# Patient Record
Sex: Female | Born: 1935 | Race: Black or African American | Hispanic: No | Smoking: Never smoker
Health system: Southern US, Community
[De-identification: ages and names within clinical notes are randomized; demographics above are authoritative.]

## PROBLEM LIST (undated history)

## (undated) DIAGNOSIS — M949 Disorder of cartilage, unspecified: Secondary | ICD-10-CM

## (undated) DIAGNOSIS — K5730 Diverticulosis of large intestine without perforation or abscess without bleeding: Secondary | ICD-10-CM

## (undated) DIAGNOSIS — I872 Venous insufficiency (chronic) (peripheral): Secondary | ICD-10-CM

## (undated) DIAGNOSIS — E559 Vitamin D deficiency, unspecified: Secondary | ICD-10-CM

## (undated) DIAGNOSIS — I10 Essential (primary) hypertension: Secondary | ICD-10-CM

## (undated) DIAGNOSIS — M899 Disorder of bone, unspecified: Secondary | ICD-10-CM

## (undated) DIAGNOSIS — M159 Polyosteoarthritis, unspecified: Secondary | ICD-10-CM

## (undated) DIAGNOSIS — H4920 Sixth [abducent] nerve palsy, unspecified eye: Secondary | ICD-10-CM

## (undated) DIAGNOSIS — G510 Bell's palsy: Secondary | ICD-10-CM

## (undated) DIAGNOSIS — E7800 Pure hypercholesterolemia, unspecified: Secondary | ICD-10-CM

## (undated) DIAGNOSIS — H9190 Unspecified hearing loss, unspecified ear: Secondary | ICD-10-CM

## (undated) DIAGNOSIS — L723 Sebaceous cyst: Secondary | ICD-10-CM

## (undated) HISTORY — PX: CATARACT EXTRACTION: SUR2

## (undated) HISTORY — DX: Disorder of cartilage, unspecified: M94.9

## (undated) HISTORY — DX: Disorder of bone, unspecified: M89.9

## (undated) HISTORY — DX: Unspecified hearing loss, unspecified ear: H91.90

## (undated) HISTORY — DX: Pure hypercholesterolemia, unspecified: E78.00

## (undated) HISTORY — DX: Polyosteoarthritis, unspecified: M15.9

## (undated) HISTORY — DX: Essential (primary) hypertension: I10

## (undated) HISTORY — DX: Sixth (abducent) nerve palsy, unspecified eye: H49.20

## (undated) HISTORY — DX: Venous insufficiency (chronic) (peripheral): I87.2

## (undated) HISTORY — DX: Bell's palsy: G51.0

## (undated) HISTORY — DX: Vitamin D deficiency, unspecified: E55.9

## (undated) HISTORY — DX: Sebaceous cyst: L72.3

## (undated) HISTORY — DX: Diverticulosis of large intestine without perforation or abscess without bleeding: K57.30

---

## 1972-06-27 HISTORY — PX: VESICOVAGINAL FISTULA CLOSURE W/ TAH: SUR271

## 1999-07-07 ENCOUNTER — Encounter: Admission: RE | Admit: 1999-07-07 | Discharge: 1999-07-07 | Payer: Self-pay | Admitting: Pulmonary Disease

## 1999-07-07 ENCOUNTER — Encounter: Payer: Self-pay | Admitting: Pulmonary Disease

## 1999-07-13 ENCOUNTER — Other Ambulatory Visit: Admission: RE | Admit: 1999-07-13 | Discharge: 1999-07-13 | Payer: Self-pay | Admitting: Obstetrics & Gynecology

## 1999-07-15 ENCOUNTER — Encounter: Admission: RE | Admit: 1999-07-15 | Discharge: 1999-07-15 | Payer: Self-pay | Admitting: Pulmonary Disease

## 1999-07-15 ENCOUNTER — Encounter: Payer: Self-pay | Admitting: Pulmonary Disease

## 1999-12-14 ENCOUNTER — Encounter: Admission: RE | Admit: 1999-12-14 | Discharge: 1999-12-14 | Payer: Self-pay | Admitting: Diagnostic Radiology

## 1999-12-14 ENCOUNTER — Encounter: Payer: Self-pay | Admitting: Diagnostic Radiology

## 2000-11-29 ENCOUNTER — Other Ambulatory Visit: Admission: RE | Admit: 2000-11-29 | Discharge: 2000-11-29 | Payer: Self-pay | Admitting: Obstetrics & Gynecology

## 2001-12-26 ENCOUNTER — Other Ambulatory Visit: Admission: RE | Admit: 2001-12-26 | Discharge: 2001-12-26 | Payer: Self-pay | Admitting: Obstetrics & Gynecology

## 2001-12-31 ENCOUNTER — Encounter: Payer: Self-pay | Admitting: Obstetrics & Gynecology

## 2001-12-31 ENCOUNTER — Encounter: Admission: RE | Admit: 2001-12-31 | Discharge: 2001-12-31 | Payer: Self-pay | Admitting: Obstetrics & Gynecology

## 2002-01-04 ENCOUNTER — Encounter: Payer: Self-pay | Admitting: Obstetrics & Gynecology

## 2002-01-04 ENCOUNTER — Encounter: Admission: RE | Admit: 2002-01-04 | Discharge: 2002-01-04 | Payer: Self-pay | Admitting: Obstetrics & Gynecology

## 2003-02-04 ENCOUNTER — Other Ambulatory Visit: Admission: RE | Admit: 2003-02-04 | Discharge: 2003-02-04 | Payer: Self-pay | Admitting: Obstetrics & Gynecology

## 2003-03-13 ENCOUNTER — Encounter: Payer: Self-pay | Admitting: Obstetrics & Gynecology

## 2003-03-13 ENCOUNTER — Encounter: Admission: RE | Admit: 2003-03-13 | Discharge: 2003-03-13 | Payer: Self-pay | Admitting: Obstetrics & Gynecology

## 2004-03-24 ENCOUNTER — Encounter: Admission: RE | Admit: 2004-03-24 | Discharge: 2004-03-24 | Payer: Self-pay | Admitting: Pulmonary Disease

## 2004-04-27 ENCOUNTER — Ambulatory Visit: Payer: Self-pay | Admitting: Pulmonary Disease

## 2004-05-12 ENCOUNTER — Ambulatory Visit: Payer: Self-pay | Admitting: Pulmonary Disease

## 2004-08-27 ENCOUNTER — Ambulatory Visit: Payer: Self-pay | Admitting: Pulmonary Disease

## 2004-12-31 ENCOUNTER — Ambulatory Visit: Payer: Self-pay | Admitting: Pulmonary Disease

## 2005-03-25 ENCOUNTER — Encounter: Admission: RE | Admit: 2005-03-25 | Discharge: 2005-03-25 | Payer: Self-pay | Admitting: Pulmonary Disease

## 2005-07-01 ENCOUNTER — Ambulatory Visit: Payer: Self-pay | Admitting: Pulmonary Disease

## 2005-12-29 ENCOUNTER — Ambulatory Visit: Payer: Self-pay | Admitting: Pulmonary Disease

## 2006-03-28 ENCOUNTER — Encounter: Admission: RE | Admit: 2006-03-28 | Discharge: 2006-03-28 | Payer: Self-pay | Admitting: Pulmonary Disease

## 2006-04-12 ENCOUNTER — Ambulatory Visit: Payer: Self-pay | Admitting: Pulmonary Disease

## 2006-05-29 ENCOUNTER — Ambulatory Visit: Payer: Self-pay | Admitting: Gastroenterology

## 2006-05-31 ENCOUNTER — Ambulatory Visit: Payer: Self-pay | Admitting: Pulmonary Disease

## 2006-06-28 ENCOUNTER — Ambulatory Visit: Payer: Self-pay | Admitting: Gastroenterology

## 2006-07-10 ENCOUNTER — Ambulatory Visit: Payer: Self-pay | Admitting: Gastroenterology

## 2006-11-27 ENCOUNTER — Ambulatory Visit: Payer: Self-pay | Admitting: Pulmonary Disease

## 2006-11-27 LAB — CONVERTED CEMR LAB
Albumin: 3.6 g/dL (ref 3.5–5.2)
Basophils Absolute: 0 10*3/uL (ref 0.0–0.1)
Chloride: 107 meq/L (ref 96–112)
Cholesterol: 184 mg/dL (ref 0–200)
Glucose, Bld: 99 mg/dL (ref 70–99)
HCT: 37.4 % (ref 36.0–46.0)
LDL Cholesterol: 113 mg/dL — ABNORMAL HIGH (ref 0–99)
Monocytes Absolute: 0.5 10*3/uL (ref 0.2–0.7)
Monocytes Relative: 15.6 % — ABNORMAL HIGH (ref 3.0–11.0)
Neutro Abs: 2 10*3/uL (ref 1.4–7.7)
Neutrophils Relative %: 63.9 % (ref 43.0–77.0)
Potassium: 4.1 meq/L (ref 3.5–5.1)
RBC: 4.62 M/uL (ref 3.87–5.11)
Total CHOL/HDL Ratio: 3.4
Triglycerides: 86 mg/dL (ref 0–149)
VLDL: 17 mg/dL (ref 0–40)
WBC: 3.1 10*3/uL — ABNORMAL LOW (ref 4.5–10.5)

## 2007-04-12 ENCOUNTER — Encounter: Admission: RE | Admit: 2007-04-12 | Discharge: 2007-04-12 | Payer: Self-pay | Admitting: Pulmonary Disease

## 2007-04-13 ENCOUNTER — Ambulatory Visit: Payer: Self-pay | Admitting: Pulmonary Disease

## 2007-04-17 DIAGNOSIS — I10 Essential (primary) hypertension: Secondary | ICD-10-CM

## 2007-04-17 DIAGNOSIS — M159 Polyosteoarthritis, unspecified: Secondary | ICD-10-CM | POA: Insufficient documentation

## 2007-05-28 ENCOUNTER — Ambulatory Visit: Payer: Self-pay | Admitting: Pulmonary Disease

## 2007-05-28 DIAGNOSIS — I872 Venous insufficiency (chronic) (peripheral): Secondary | ICD-10-CM | POA: Insufficient documentation

## 2007-09-03 ENCOUNTER — Ambulatory Visit: Payer: Self-pay | Admitting: Pulmonary Disease

## 2007-09-03 DIAGNOSIS — L723 Sebaceous cyst: Secondary | ICD-10-CM

## 2007-09-03 DIAGNOSIS — E7800 Pure hypercholesterolemia, unspecified: Secondary | ICD-10-CM | POA: Insufficient documentation

## 2007-12-27 ENCOUNTER — Encounter: Payer: Self-pay | Admitting: Pulmonary Disease

## 2008-01-18 ENCOUNTER — Ambulatory Visit: Payer: Self-pay | Admitting: Pulmonary Disease

## 2008-01-18 DIAGNOSIS — M899 Disorder of bone, unspecified: Secondary | ICD-10-CM | POA: Insufficient documentation

## 2008-01-18 DIAGNOSIS — H9190 Unspecified hearing loss, unspecified ear: Secondary | ICD-10-CM | POA: Insufficient documentation

## 2008-01-18 DIAGNOSIS — H4920 Sixth [abducent] nerve palsy, unspecified eye: Secondary | ICD-10-CM

## 2008-01-18 DIAGNOSIS — G510 Bell's palsy: Secondary | ICD-10-CM

## 2008-01-18 DIAGNOSIS — K5730 Diverticulosis of large intestine without perforation or abscess without bleeding: Secondary | ICD-10-CM | POA: Insufficient documentation

## 2008-01-18 DIAGNOSIS — M949 Disorder of cartilage, unspecified: Secondary | ICD-10-CM

## 2008-01-19 LAB — CONVERTED CEMR LAB
ALT: 12 units/L (ref 0–35)
Alkaline Phosphatase: 59 units/L (ref 39–117)
Basophils Absolute: 0 10*3/uL (ref 0.0–0.1)
Bilirubin, Direct: 0.1 mg/dL (ref 0.0–0.3)
CO2: 31 meq/L (ref 19–32)
Cholesterol: 169 mg/dL (ref 0–200)
Creatinine, Ser: 0.9 mg/dL (ref 0.4–1.2)
Eosinophils Relative: 8.1 % — ABNORMAL HIGH (ref 0.0–5.0)
Glucose, Bld: 94 mg/dL (ref 70–99)
HDL: 49.8 mg/dL (ref >39.0)
Hemoglobin: 12.4 g/dL (ref 12.0–15.0)
LDL Cholesterol: 106 mg/dL — ABNORMAL HIGH (ref 0–99)
MCHC: 33.2 g/dL (ref 30.0–36.0)
Monocytes Absolute: 0.5 10*3/uL (ref 0.1–1.0)
Monocytes Relative: 16.1 % — ABNORMAL HIGH (ref 3.0–12.0)
Platelets: 231 10*3/uL (ref 150–400)
RDW: 13 % (ref 11.5–14.6)
Sodium: 143 meq/L (ref 135–145)
Total CHOL/HDL Ratio: 3.4
VLDL: 13 mg/dL (ref 0–40)
WBC: 3.4 10*3/uL — ABNORMAL LOW (ref 4.5–10.5)

## 2008-02-07 ENCOUNTER — Encounter: Payer: Self-pay | Admitting: Pulmonary Disease

## 2008-02-08 ENCOUNTER — Encounter: Payer: Self-pay | Admitting: Pulmonary Disease

## 2008-02-08 LAB — CONVERTED CEMR LAB: Vit D, 1,25-Dihydroxy: 15 — ABNORMAL LOW (ref 30–89)

## 2008-07-18 ENCOUNTER — Ambulatory Visit: Payer: Self-pay | Admitting: Pulmonary Disease

## 2008-07-28 LAB — CONVERTED CEMR LAB
BUN: 8 mg/dL (ref 6–23)
CO2: 32 meq/L (ref 19–32)
Creatinine, Ser: 0.8 mg/dL (ref 0.4–1.2)
Sodium: 144 meq/L (ref 135–145)
Vit D, 1,25-Dihydroxy: 27 — ABNORMAL LOW (ref 30–89)

## 2008-08-26 ENCOUNTER — Encounter: Payer: Self-pay | Admitting: Pulmonary Disease

## 2009-01-14 ENCOUNTER — Ambulatory Visit: Payer: Self-pay | Admitting: Pulmonary Disease

## 2009-01-18 DIAGNOSIS — E559 Vitamin D deficiency, unspecified: Secondary | ICD-10-CM | POA: Insufficient documentation

## 2009-01-18 LAB — CONVERTED CEMR LAB
Alkaline Phosphatase: 56 units/L (ref 39–117)
Basophils Absolute: 0 10*3/uL (ref 0.0–0.1)
Basophils Relative: 0.1 % (ref 0.0–3.0)
Calcium: 8.9 mg/dL (ref 8.4–10.5)
Chloride: 105 meq/L (ref 96–112)
Cholesterol: 174 mg/dL (ref 0–200)
Creatinine, Ser: 0.9 mg/dL (ref 0.4–1.2)
GFR calc non Af Amer: 76.46 mL/min (ref >60)
HDL: 54 mg/dL (ref >39.00)
Hemoglobin: 12.3 g/dL (ref 12.0–15.0)
Lymphocytes Relative: 14.1 % (ref 12.0–46.0)
Lymphs Abs: 0.5 10*3/uL — ABNORMAL LOW (ref 0.7–4.0)
Monocytes Absolute: 0.3 10*3/uL (ref 0.1–1.0)
Monocytes Relative: 10.1 % (ref 3.0–12.0)
Neutro Abs: 2.4 10*3/uL (ref 1.4–7.7)
Platelets: 211 10*3/uL (ref 150.0–400.0)
RDW: 13.1 % (ref 11.5–14.6)
Sodium: 144 meq/L (ref 135–145)
TSH: 1.11 microintl units/mL (ref 0.35–5.50)
Total Bilirubin: 0.8 mg/dL (ref 0.3–1.2)
Total CHOL/HDL Ratio: 3
Vit D, 25-Hydroxy: 48 ng/mL (ref 30–89)

## 2009-03-25 ENCOUNTER — Ambulatory Visit: Payer: Self-pay | Admitting: Pulmonary Disease

## 2009-03-30 ENCOUNTER — Encounter: Payer: Self-pay | Admitting: Pulmonary Disease

## 2009-03-31 ENCOUNTER — Telehealth: Payer: Self-pay | Admitting: Pulmonary Disease

## 2009-04-28 ENCOUNTER — Telehealth (INDEPENDENT_AMBULATORY_CARE_PROVIDER_SITE_OTHER): Payer: Self-pay | Admitting: *Deleted

## 2009-07-29 ENCOUNTER — Ambulatory Visit: Payer: Self-pay | Admitting: Pulmonary Disease

## 2009-07-31 LAB — CONVERTED CEMR LAB
ALT: 14 units/L (ref 0–35)
Basophils Absolute: 0 10*3/uL (ref 0.0–0.1)
CO2: 33 meq/L — ABNORMAL HIGH (ref 19–32)
Creatinine, Ser: 0.9 mg/dL (ref 0.4–1.2)
Eosinophils Relative: 5.8 % — ABNORMAL HIGH (ref 0.0–5.0)
Glucose, Bld: 91 mg/dL (ref 70–99)
HDL: 64.1 mg/dL (ref >39.00)
Lymphocytes Relative: 17.8 % (ref 12.0–46.0)
Monocytes Absolute: 0.5 10*3/uL (ref 0.1–1.0)
Monocytes Relative: 15.2 % — ABNORMAL HIGH (ref 3.0–12.0)
Neutro Abs: 1.8 10*3/uL (ref 1.4–7.7)
Platelets: 220 10*3/uL (ref 150.0–400.0)
Potassium: 3.8 meq/L (ref 3.5–5.1)
RDW: 13.5 % (ref 11.5–14.6)
Sodium: 142 meq/L (ref 135–145)
TSH: 1.15 microintl units/mL (ref 0.35–5.50)
Total CHOL/HDL Ratio: 3
Triglycerides: 74 mg/dL (ref 0.0–149.0)
VLDL: 14.8 mg/dL (ref 0.0–40.0)
Vit D, 25-Hydroxy: 35 ng/mL (ref 30–89)
WBC: 3 10*3/uL — ABNORMAL LOW (ref 4.5–10.5)

## 2009-09-10 ENCOUNTER — Ambulatory Visit: Payer: Self-pay | Admitting: Pulmonary Disease

## 2009-09-13 DIAGNOSIS — L80 Vitiligo: Secondary | ICD-10-CM

## 2010-01-25 ENCOUNTER — Ambulatory Visit: Payer: Self-pay | Admitting: Pulmonary Disease

## 2010-01-31 LAB — CONVERTED CEMR LAB
AST: 22 units/L (ref 0–37)
Alkaline Phosphatase: 62 units/L (ref 39–117)
Bilirubin, Direct: 0.1 mg/dL (ref 0.0–0.3)
Chloride: 105 meq/L (ref 96–112)
Creatinine, Ser: 0.7 mg/dL (ref 0.4–1.2)
Glucose, Bld: 96 mg/dL (ref 70–99)
Sodium: 143 meq/L (ref 135–145)
Total Bilirubin: 0.7 mg/dL (ref 0.3–1.2)
Total CHOL/HDL Ratio: 3
Triglycerides: 65 mg/dL (ref 0.0–149.0)
VLDL: 13 mg/dL (ref 0.0–40.0)

## 2010-03-22 ENCOUNTER — Ambulatory Visit: Payer: Self-pay | Admitting: Pulmonary Disease

## 2010-07-02 ENCOUNTER — Encounter: Payer: Self-pay | Admitting: Pulmonary Disease

## 2010-07-29 NOTE — Consult Note (Signed)
Summary: [facility]  [facility]   Imported By: Sherian Rein 07/13/2010 15:41:58  _____________________________________________________________________  External Attachment:    Type:   Image     Comment:   External Document

## 2010-07-29 NOTE — Assessment & Plan Note (Signed)
Summary: 6 months/apc   CC:  6 month ROV & review of mult medical problems....  History of Present Illness: 77 y/o WF here for a follow up visit... she has multiple medical problems as noted below... she has HBP, Hyperchol, DJD/ Osteopenia, Vit D defic...   ~  January 14, 2009:  she's had a good 48months.. 77 y/o- no new complaints or concerns... due for follow up FASTING labs, & both Tetanus & Pneumonia shots today...   ~  July 29, 2009:  another good 48mo- c/o some arthritis pain in hips and knees... she has Etodolac Bid which helps, offered Ortho referral but she declines "I don't want shots"... BP controlled, chol = fair on the Zetia + diet, DrLavoie stopped her Actonel for a drug holiday...   ~  January 25, 2010:  she is concerned about "spots" on her neck = vitiligo & she is reassured... also notes some constip but notes that prunesdo the job... some intermittent left shoulder blade area discomfort relieved by heat/ Tylenol... BP controlled on meds;  Chol looks good on the Zetia;  otherw stable...    Current Problem List:  HEARING LOSS (ICD-389.9) - she saw DrBates Aug09 w/ cerumen impactions removed and improvement in her hearing since then...  HYPERTENSION (ICD-401.9) - on ASA 81mg /d,  FELODIPINE 5mg /d, LISINOPRIL 40mg /d, LASIX 40mg /d... BP= 142/84 today and tolerating meds well... denies HA, fatigue, visual changes, CP, palipit, dizziness, syncope, dyspnea, edema, etc...  ~  labs 1/10 showed BUN= 8, Creat= 0.8, K= 4.6  ~  labs 2/11 showed norm BMet...  VENOUS INSUFFICIENCY (ICD-459.81) - on low salt diet, and Lasix...  HYPERCHOLESTEROLEMIA (ICD-272.0) - on diet and ZETIA 10mg /d...  ~  FLP 6/08 showed TChol 184, TG 86, HDL 54, LDL 113  ~  FLP 7/09 showed TChol 169, TG 65, HDL 50, LDL 106  ~  FLP 7/10 showed TChol 174, TG 67, HDL 54, LDL 107  ~  FLP 2/11 showed TChol 180, TG 74, HDL 64, LDL 101  ~  FLP 8/11 showed TChol 168, TG 65, HDL 53, LDL 102  DIVERTICULOSIS OF COLON  (ICD-562.10) - she notes occas constipation (uses prunes prn)... last colonoscopy 1/08 by DrPatterson was neg...  DEGENERATIVE JOINT DISEASE, GENERALIZED (ICD-715.00) - uses TYLENOL as needed, and has ETODOLAC 400mg  Prn as well... c/o hip & knee pains- offered Ortho referral but she declines... never tried the knee braces... uses a cane.  OSTEOPENIA (ICD-733.90) - on calcium, multivits, & Vit D... prev on Actonel & this was held by [facility] for a drug holiday (stopped in 2010),,,  ~  BMD 3/10 showed TScores -1.5 in Spine & -1.8 left FemNeck... continue meds.  VITAMIN D DEFICIENCY (ICD-268.9)  ~  labs 7/09 showed Vit D level= 15... rec- start Vit D 50,000 u weekly.  ~  labs 1/10 showed Vit D level = 27... continue 50K.  ~  labs 7/10 showed Vit D level = 48... OK to change to 1000 u daily.  ~  labs 2/11 showed Vit D level = 35  Hx of BELLS PALSY (ICD-351.0) - hx of left Bell's palsy in 1980... resolved w/o residual wkness...  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 Mammogram 10/08 neg... had TETANUS shot & PNEUMOVAX- given 7/10.   Preventive Screening-Counseling & Management  Alcohol-Tobacco     Smoking Status: never  Allergies (verified): No Known Drug Allergies  Comments:  Nurse/Medical Assistant: The patient's medications and allergies were reviewed with the patient and were updated in the Medication and Allergy Lists.  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 mother died age 34    father died age 55   hx of dm 2 siblings 1 brother alive age 41  hx of dm 1 brother alive age 49---hx of hip replacements  Social History: Reviewed history from 01/18/2008 and no changes required. widowed retired never smoked no etoh 5 adopted children  Review of Systems      See HPI       The patient complains of dyspnea on exertion and difficulty walking.  The patient denies anorexia, fever, weight loss, weight gain, vision loss, decreased hearing, hoarseness, chest pain, syncope, peripheral edema, prolonged cough, headaches, hemoptysis, abdominal pain, melena, hematochezia, severe indigestion/heartburn, hematuria, incontinence, muscle weakness, suspicious skin lesions, transient blindness, depression, unusual weight change, abnormal bleeding, enlarged lymph nodes, and angioedema.    Vital Signs:  Patient profile:   77 year old female Height:      63.5 inches Weight:      162.38 pounds BMI:     28.42 O2 Sat:      100 % on Room air Temp:     99.4 degrees F oral Pulse rate:   84 / minute BP sitting:   142 / 84  (right arm) Cuff size:   regular  Vitals Entered By: Randell Loop CMA (January 25, 2010 11:24 AM)  O2 Sat at Rest %:  100 O2 Flow:  Room air CC: 6 month ROV & review of mult medical problems... Is Patient Diabetic? No Pain Assessment Patient in pain? no      Comments no med changes today   Physical Exam  Additional Exam:  WD, WN, 77 y/o BF in NAD... GENERAL:  Alert & oriented; pleasant & cooperative... HEENT:  [place]/AT, EOM-wnl, PERRLA, EACs- recurrent debris bilat, TMs- not well seen, NOSE-clear, THROAT-clear & wnl. NECK:  Supple w/ fairROM; no JVD; normal carotid impulses w/o bruits; no thyromegaly or nodules palpated; no lymphadenopathy. CHEST:  Clear to P & A; without wheezes/ rales/ or rhonchi  heard... HEART:  Regular Rhythm; gr 1/6 SEM, no rubs or gallops detected... ABDOMEN:  Soft & nontender; normal bowel sounds; no organomegaly or masses palpated... EXT: without deformities, mod arthritic changes; no varicose veins/ +venous insuffic/ tr edema. NEURO:  CN's intact; no focal neuro deficits... DERM:  No lesions noted; no rash etc...    MISC. Report  Procedure date:  01/25/2010  Findings:      BMP (METABOL)   Sodium                    143 mEq/L                   135-145   Potassium                 4.0 mEq/L                   3.5-5.1   Chloride  105 mEq/L                   96-112   Carbon Dioxide            31 mEq/L                    19-32   Glucose                   96 mg/dL                    16-10   BUN                       8 mg/dL                     9-60   Creatinine                0.7 mg/dL                   4.5-4.0   Calcium                   8.7 mg/dL                   9.8-11.9   GFR                       95.61 mL/min                >60  Hepatic/Liver Function Panel (HEPATIC)   Total Bilirubin           0.7 mg/dL                   1.4-7.8   Direct Bilirubin          0.1 mg/dL                   2.9-5.6   Alkaline Phosphatase      62 U/L                      39-117   AST                       22 U/L                      0-37   ALT                       10 U/L                      0-35   Total Protein             6.9 g/dL                    2.1-3.0   Albumin                   3.7 g/dL                    8.6-5.7  Tests: (3) Lipid Panel (LIPID)   Cholesterol               168 mg/dL  0-200   Triglycerides             65.0 mg/dL                  4.6-962.9   HDL                       52.84 mg/dL                 >13.24   LDL Cholesterol      [H]  401 mg/dL                   0-27   Impression & Recommendations:  Problem # 1:  HYPERTENSION (ICD-401.9) Adeq control on these meds... Her updated medication list for this problem  includes:    Felodipine 5 Mg Xr24h-tab (Felodipine) .Marland Kitchen... Take 1 tab by mouth once daily...    Lisinopril 40 Mg Tabs (Lisinopril) .Marland Kitchen... Take 1 tablet by mouth once a day    Furosemide 40 Mg Tabs (Furosemide) .Marland Kitchen... Take 1 tablet by mouth once a day  Orders: TLB-BMP (Basic Metabolic Panel-BMET) (80048-METABOL) TLB-Hepatic/Liver Function Pnl (80076-HEPATIC) TLB-Lipid Panel (80061-LIPID)  Problem # 2:  HYPERCHOLESTEROLEMIA (ICD-272.0) FLP is reasonable on the diet + Zetia Rx... Her updated medication list for this problem includes:    Zetia 10 Mg Tabs (Ezetimibe) .Marland Kitchen... Take 1 tablet by mouth once a day  Problem # 3:  DEGENERATIVE JOINT DISEASE, GENERALIZED (ICD-715.00) She uses the Etodolac & Tylenol as needed... Her updated medication list for this problem includes:    Adult Aspirin Ec Low Strength 81 Mg Tbec (Aspirin) .Marland Kitchen... Take 1 tablet by mouth once a day    Etodolac 400 Mg Tabs (Etodolac) .Marland Kitchen... Take 1 tab by mouth two times a day w/ food as needed for arthritis pain...  Problem # 4:  OSTEOPENIA (ICD-733.90) GYN has her on a drug holiday... reminded to take the calcium, MVI, Vit D supplements...  Problem # 5:  OTHER MEDICAL PROBLEMS AS NOTED>>> All in all she is doing remarkably well for 88...  Complete Medication List: 1)  Adult Aspirin Ec Low Strength 81 Mg Tbec (Aspirin) .... Take 1 tablet by mouth once a day 2)  Felodipine 5 Mg Xr24h-tab (Felodipine) .... Take 1 tab by mouth once daily.Marland KitchenMarland Kitchen 3)  Lisinopril 40 Mg Tabs (Lisinopril) .... Take 1 tablet by mouth once a day 4)  Furosemide 40 Mg Tabs (Furosemide) .... Take 1 tablet by mouth once a day 5)  Zetia 10 Mg Tabs (Ezetimibe) .... Take 1 tablet by mouth once a day 6)  Etodolac 400 Mg Tabs (Etodolac) .... Take 1 tab by mouth two times a day w/ food as needed for arthritis pain.Marland KitchenMarland Kitchen 7)  Vitamin D 1000 Unit Caps (Cholecalciferol) .... Take 1 cap by mouth once daily...  Patient Instructions: 1)  Today we updated your med list- see  below.... 2)  Continue your current meds the same... 3)  Today we did your follow up FASTING blood work... please call the "phone tree" in a few days for your lab results.Marland KitchenMarland Kitchen 4)  Call for any questions & remember to stay as active as poss... 5)  Please schedule a follow-up appointment in 6 months.

## 2010-07-29 NOTE — Assessment & Plan Note (Signed)
Summary: follow up pt has 2 accts other # [id number]   CC:  6 month ROV & review of mult medical problems....  History of Present Illness: 77 y/o WF here for a follow up visit... she has multiple medical problems as noted below... she has HBP, Hyperchol, DJD/ Osteopenia, Vit D defic...   ~  January 14, 2009:  she's had a good 13months.. 77 y/o- no new complaints or concerns... due for follow up FASTING labs, & both Tetanus & Pneumonia shots today...   ~  July 29, 2009:  another good 13mo- c/o some arthritis pain in hips and knees... she has Etodolac Bid which helps, offered Ortho referral but she declines "I don't want shots"... BP controlled, chol = fair on the Zetia + diet, DrLavoie stopped her Actonel for a drug holiday...    Current Problem List:  HEARING LOSS (ICD-389.9) - she saw DrBates Aug09 w/ cerumen impactions removed and improvement in her hearing since then...  HYPERTENSION (ICD-401.9) - on ASA 81mg /d,  FELODIPINE 5mg /d, LISINOPRIL 40mg /d, LASIX 40mg /d... BP= 140/80 today and tolerating meds well... denies HA, fatigue, visual changes, CP, palipit, dizziness, syncope, dyspnea, edema, etc...  ~  labs 1/10 showed BUN= 8, Creat= 0.8, K= 4.6  ~  labs 2/11 showed norm BMet...  VENOUS INSUFFICIENCY (ICD-459.81) - on low salt diet, and Lasix...  HYPERCHOLESTEROLEMIA (ICD-272.0) - on diet and ZETIA 10mg /d...  ~  FLP 6/08 showed TChol 184, TG 86, HDL 54, LDL 113  ~  FLP 7/09 showed TChol 169, TG 65, HDL 50, LDL 106  ~  FLP 7/10 showed TChol 174, TG 67, HDL 54, LDL 107  ~  FLP 2/11 showed TChol 180, TG 74, HDL 64, LDL 101  DIVERTICULOSIS OF COLON (ICD-562.10) - she notes occas constipation (uses prunes prn)... last colonoscopy 1/08 by DrPatterson was neg...  DEGENERATIVE JOINT DISEASE, GENERALIZED (ICD-715.00) - on ETODOLAC 400mg Bid as needed... c/o hip & knee pains- offered Ortho referral but she declines... never tried the knee braces... uses a cane.  OSTEOPENIA (ICD-733.90) -  on calcium, multivits, & Vit D... prev on Actonel & this was held by [facility] for a drug holiday (stopped in 2010),,,  ~  BMD 3/10 showed TScores -1.5 in Spine & -1.8 left FemNeck... continue meds.  VITAMIN D DEFICIENCY (ICD-268.9)  ~  labs 7/09 showed Vit D level= 15... rec- start Vit D 50,000 u weekly.  ~  labs 1/10 showed Vit D level = 27... continue 50K.  ~  labs 7/10 showed Vit D level = 48... OK to change to 1000 u daily.  ~  labs 2/11 showed Vit D level = 35  Hx of BELLS PALSY (ICD-351.0) - hx of left Bell's palsy in 1980... resolved w/o residual wkness...  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 Mammogram 10/08 neg... had TETANUS shot & PNEUMOVAX- given 7/10.   R Allergies (verified): No Known Drug Allergies  Comments:  Nurse/Medical Assistant: The patient's medications and allergies were reviewed with the patient and were updated in the Medication and Allergy Lists.  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 mother died age 43    father died age 93   hx of dm 2 siblings 1 brother alive age 74  hx of dm 1 brother alive age 55---hx of hip replacements  Social History: Reviewed history from 01/18/2008 and no changes required. widowed retired never smoked no etoh 5 adopted children  Review of Systems      See HPI        The patient complains of dyspnea on exertion, muscle weakness, and difficulty walking.  The patient denies anorexia, fever, weight loss, weight gain, vision loss, decreased hearing, hoarseness, chest pain, syncope, peripheral edema, prolonged cough, headaches, hemoptysis, abdominal pain, melena, hematochezia, severe indigestion/heartburn, hematuria, incontinence, suspicious skin lesions, transient blindness, depression, unusual weight change, abnormal bleeding, enlarged lymph nodes, and angioedema.    Vital Signs:  Patient profile:   77 year old female Height:      63.5 inches Weight:      164.50 pounds O2 Sat:      97 % on Room air Temp:     98.1 degrees F oral Pulse rate:   70 / minute BP sitting:   140 / 80  (right arm) Cuff size:   regular  Vitals Entered By: Randell Loop CMA (July 29, 2009 3:11 PM)  O2 Sat at Rest %:  97 O2 Flow:  Room air CC: 6 month ROV & review of mult medical problems... Is Patient Diabetic? No Pain Assessment Patient in pain? yes      Onset of pain  rt hip down into leg that has been hurting for some time Comments pt brought all meds today---meds updated today   Physical Exam  Additional Exam:  WD, WN, 77 y/o BF in NAD... GENERAL:  Alert & oriented; pleasant & cooperative... HEENT:  [place]/AT, EOM-wnl, PERRLA, EACs- recurrent debris bilat, TMs- not well seen, NOSE-clear, THROAT-clear & wnl. NECK:  Supple w/ fairROM; no JVD; normal carotid impulses w/o bruits; no thyromegaly or nodules palpated; no lymphadenopathy. CHEST:  Clear to P & A; without wheezes/ rales/ or rhonchi heard... HEART:  Regular Rhythm; gr 1/6 SEM, no rubs or gallops detected... ABDOMEN:  Soft & nontender; normal bowel sounds; no organomegaly or masses palpated... EXT: without deformities, mod arthritic changes; no varicose veins/ +venous insuffic/ tr edema. NEURO:  CN's intact; no focal neuro deficits... DERM:  No lesions noted; no rash etc...     MISC. Report  Procedure  date:  07/29/2009  Findings:      Lipid Panel (LIPID)   Cholesterol               180 mg/dL                   9-562   Triglycerides             74.0 mg/dL                  1.3-086.5   HDL                       78.46 mg/dL                 >96.29   LDL Cholesterol      [H]  528 mg/dL                   4-13  BMP (METABOL)   Sodium  142 mEq/L                   135-145   Potassium                 3.8 mEq/L                   3.5-5.1   Chloride                  101 mEq/L                   96-112   Carbon Dioxide       [H]  33 mEq/L                    19-32   Glucose                   91 mg/dL                    29-56   BUN                  [L]  5 mg/dL                     2-13   Creatinine                0.9 mg/dL                   0.8-6.5   Calcium                   9.1 mg/dL                   7.8-46.9   GFR                       76.37 mL/min                >60  Hepatic/Liver Function Panel (HEPATIC)   Total Bilirubin           0.6 mg/dL                   6.2-9.5   Direct Bilirubin          0.1 mg/dL                   2.8-4.1   Alkaline Phosphatase      61 U/L                      39-117   AST                       21 U/L                      0-37   ALT                       14 U/L                      0-35   Total Protein             7.3 g/dL                    3.2-4.4   Albumin  3.8 g/dL                    0.4-5.4  Comments:      CBC Platelet w/Diff (CBCD)   White Cell Count     [L]  3.0 K/uL                    4.5-10.5   Red Cell Count            4.45 Mil/uL                 3.87-5.11   Hemoglobin                12.2 g/dL                   09.8-11.9   Hematocrit                37.6 %                      36.0-46.0   MCV                       84.4 fl                     78.0-100.0   Platelet Count            220.0 K/uL                  150.0-400.0   Neutrophil %              60.1 %                      43.0-77.0   Lymphocyte %              17.8 %                       12.0-46.0   Monocyte %           [H]  15.2 %                      3.0-12.0   Eosinophils%         [H]  5.8 %                       0.0-5.0   Basophils %               1.1 %                       0.0-3.0   TSH (TSH)   FastTSH                   1.15 uIU/mL                 0.35-5.50  Vitamin D (25-Hydroxy) (14782)  Vitamin D (25-Hydroxy)                             35 ng/mL                    30-89   Impression & Recommendations:  Problem # 1:  HYPERTENSION (ICD-401.9) Controlled-  same meds, +diet, try to lose a few lbs... Her updated medication list for  this problem includes:    Felodipine 5 Mg Xr24h-tab (Felodipine) .Marland Kitchen... Take 1 tab by mouth once daily...    Lisinopril 40 Mg Tabs (Lisinopril) .Marland Kitchen... Take 1 tablet by mouth once a day    Furosemide 40 Mg Tabs (Furosemide) .Marland Kitchen... Take 1 tablet by mouth once a day  Orders: TLB-Lipid Panel (80061-LIPID) TLB-BMP (Basic Metabolic Panel-BMET) (80048-METABOL) TLB-Hepatic/Liver Function Pnl (80076-HEPATIC) TLB-CBC Platelet - w/Differential (85025-CBCD) TLB-TSH (Thyroid Stimulating Hormone) (84443-TSH) T-Vitamin D (25-Hydroxy) (16109-60454)  Problem # 2:  VENOUS INSUFFICIENCY (ICD-459.81) She knows to avoid sodium, elevate legs, wear support hose, & use the Lasix.  Problem # 3:  HYPERCHOLESTEROLEMIA (ICD-272.0) F/u FLP on Zetia looks good-  OK to continue same off statin Rx Her updated medication list for this problem includes:    Zetia 10 Mg Tabs (Ezetimibe) .Marland Kitchen... Take 1 tablet by mouth once a day  Problem # 4:  DIVERTICULOSIS OF COLON (ICD-562.10) GI is stable...  Problem # 5:  DEGENERATIVE JOINT DISEASE, GENERALIZED (ICD-715.00) Her CC is pain in hips and knees but she doesn't want Ortho referral... Her updated medication list for this problem includes:    Adult Aspirin Ec Low Strength 81 Mg Tbec (Aspirin) .Marland Kitchen... Take 1 tablet by mouth once a day    Etodolac 400 Mg Tabs (Etodolac) .Marland Kitchen... Take 1 tab by mouth two  times a day w/ food as needed for arthritis pain...  Problem # 6:  OSTEOPENIA (ICD-733.90) On Actonel drug holiday per [facility]... The following medications were removed from the medication list:    Actonel 35 Mg Tabs (Risedronate sodium) .Marland Kitchen... Take once a week  Problem # 7:  VITAMIN D DEFICIENCY (ICD-268.9) REC to continue the Vit D 1000 u daily...  Problem # 8:  OTHER MEDICAL PROBLEMS AS NOTED>>>  Complete Medication List: 1)  Adult Aspirin Ec Low Strength 81 Mg Tbec (Aspirin) .... Take 1 tablet by mouth once a day 2)  Felodipine 5 Mg Xr24h-tab (Felodipine) .... Take 1 tab by mouth once daily.Marland KitchenMarland Kitchen 3)  Lisinopril 40 Mg Tabs (Lisinopril) .... Take 1 tablet by mouth once a day 4)  Furosemide 40 Mg Tabs (Furosemide) .... Take 1 tablet by mouth once a day 5)  Zetia 10 Mg Tabs (Ezetimibe) .... Take 1 tablet by mouth once a day 6)  Oscal 500/200 D-3 500-200 Mg-unit Tabs (Calcium-vitamin d) .... Take one tablet by mouth two times a day 7)  Multivitamins Tabs (Multiple vitamin) .... Take 1 tab by mouth once daily.Marland KitchenMarland Kitchen 8)  Vitamin D 1000 Unit Caps (Cholecalciferol) .... Take 1 cap by mouth once daily.Marland KitchenMarland Kitchen 9)  Etodolac 400 Mg Tabs (Etodolac) .... Take 1 tab by mouth two times a day w/ food as needed for arthritis pain...  Patient Instructions: 1)  Today we updated your med list- see below.... 2)  Continue your present meds the same... 3)  Today we did your follow up FASTING blood work... please call the "phone tree" in a few days for your lab results.Marland KitchenMarland Kitchen 4)  Stay as active as poss & bee as careful as you can... 5)  Call for any problems.Marland KitchenMarland Kitchen 6)  Please schedule a follow-up appointment in 6 months.

## 2010-07-29 NOTE — Assessment & Plan Note (Signed)
Summary: flu shot/ mbw   Nurse Visit Flu Vaccine Consent Questions     Do you have a history of severe allergic reactions to this vaccine? no    Any prior history of allergic reactions to egg and/or gelatin? no    Do you have a sensitivity to the preservative Thimersol? no    Do you have a past history of Guillan-Barre Syndrome? no    Do you currently have an acute febrile illness? no    Have you ever had a severe reaction to latex? no    Vaccine information given and explained to patient? yes    Are you currently pregnant? no    Lot Number:[id number]   Exp Date:12/25/2010   Site Given  Left Deltoid IM  .Kandice Hams CMA  March 22, 2010 2:22 PM    Allergies: No Known Drug Allergies  Orders Added: 1)  Flu Vaccine 67yrs + MEDICARE PATIENTS [[id number]] 2)  Administration Flu vaccine - MCR [[id number]]

## 2010-07-29 NOTE — Assessment & Plan Note (Signed)
Summary: Acute NP office visit   CC:  light-colored spot on left side of neck that itches x2-3weeks that pt noticed is spreading yesterday.  History of Present Illness: 77 year old female with known history of hyperlipdemia, HTN   September 10, 2009 --Presents for an acute office visit. Complains of light-colored spot on left side of neck that itches x2-3weeks that pt noticed is spreading yesterday. Sister has something like this on her hands. No new meds/redness, travel or known insect bite.   Medications Prior to Update: 1)  Adult Aspirin Ec Low Strength 81 Mg  Tbec (Aspirin) .... Take 1 Tablet By Mouth Once A Day 2)  Felodipine 5 Mg Xr24h-Tab (Felodipine) .... Take 1 Tab By Mouth Once Daily.Marland KitchenMarland Kitchen 3)  Lisinopril 40 Mg  Tabs (Lisinopril) .... Take 1 Tablet By Mouth Once A Day 4)  Furosemide 40 Mg  Tabs (Furosemide) .... Take 1 Tablet By Mouth Once A Day 5)  Zetia 10 Mg  Tabs (Ezetimibe) .... Take 1 Tablet By Mouth Once A Day 6)  Oscal 500/200 D-3 500-200 Mg-Unit Tabs (Calcium-Vitamin D) .... Take One Tablet By Mouth Two Times A Day 7)  Multivitamins   Tabs (Multiple Vitamin) .... Take 1 Tab By Mouth Once Daily.Marland KitchenMarland Kitchen 8)  Vitamin D 1000 Unit Caps (Cholecalciferol) .... Take 1 Cap By Mouth Once Daily.Marland KitchenMarland Kitchen 9)  Etodolac 400 Mg  Tabs (Etodolac) .... Take 1 Tab By Mouth Two Times A Day W/ Food As Needed For Arthritis Pain...  Current Medications (verified): 1)  Adult Aspirin Ec Low Strength 81 Mg  Tbec (Aspirin) .... Take 1 Tablet By Mouth Once A Day 2)  Felodipine 5 Mg Xr24h-Tab (Felodipine) .... Take 1 Tab By Mouth Once Daily.Marland KitchenMarland Kitchen 3)  Lisinopril 40 Mg  Tabs (Lisinopril) .... Take 1 Tablet By Mouth Once A Day 4)  Furosemide 40 Mg  Tabs (Furosemide) .... Take 1 Tablet By Mouth Once A Day 5)  Zetia 10 Mg  Tabs (Ezetimibe) .... Take 1 Tablet By Mouth Once A Day 6)  Vitamin D 1000 Unit Caps (Cholecalciferol) .... Take 1 Cap By Mouth Once Daily.Marland KitchenMarland Kitchen 7)  Etodolac 400 Mg  Tabs (Etodolac) .... Take 1 Tab By Mouth Two  Times A Day W/ Food As Needed For Arthritis Pain...  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) SEBACEOUS CYST (ICD-706.2)  Past Surgical History: Last updated: 07/29/2009 S/P hysterectomy 1974 S/P cataract surgery  Family History: Last updated: 2008/02/02 mother died age 38    father died age 75   hx of dm 2 siblings 1 brother alive age 30  hx of dm 1 brother alive age 23---hx of hip replacements  Social History: Last updated: 02-Feb-2008 widowed retired never smoked no etoh 5 adopted children  Risk Factors: Smoking Status: never (07/18/2008)  Review of Systems      See HPI  Vital Signs:  Patient profile:   77 year old female Height:      63.5 inches Weight:      164.25 pounds O2 Sat:      100 % on Room air Temp:     97.0 degrees F oral Pulse rate:   71 / minute BP sitting:   102 / 64  (left arm)  Cuff size:   regular  Vitals Entered By: Boone Master CNA (September 10, 2009 4:12 PM)  O2 Flow:  Room air CC: light-colored spot on left side of neck that itches x2-3weeks that pt noticed is spreading yesterday Is Patient Diabetic? No Comments Medications reviewed with patient Daytime contact number verified with patient. Boone Master CNA  September 10, 2009 4:12 PM    Physical Exam  Additional Exam:  WD, WN, 77 y/o BF in NAD... GENERAL:  Alert & oriented; pleasant & cooperative... HEENT:  [place]/AT, EOM-wnl, PERRLA, EACs-nml  NOSE-clear, THROAT-clear & wnl. NECK:  Supple w/ fairROM; no JVD; normal carotid impulses w/o bruits; no  thyromegaly or nodules palpated; no lymphadenopathy. CHEST:  Clear to P & A; without wheezes/ rales/ or rhonchi heard... HEART:  Regular Rhythm; gr 1/6 SEM, no rubs or gallops detected... ABDOMEN:  Soft & nontender; normal bowel sounds; no organomegaly or masses palpated... EXT: without deformities, mod arthritic changes; no varicose veins/ +venous insuffic/ tr edema. NEURO:  CN's intact; no focal neuro deficits... DERM:  along left lateral neck circular patch of hypopigmented skin and along mid neck circular patch of hypopigmented skin.  has several spots along lower legs.       Impression & Recommendations:  Problem # 1:  VITILIGO (ICD-709.01)  Hypopigmented skin along neck - suspect Vitiligo does not look like Tinea verisicolor.  discussed vitiligo , no specific tx. if worsens and pt wants can refer to dermatology.   Orders: Est. Patient Level II ([address])  Complete Medication List: 1)  Adult Aspirin Ec Low Strength 81 Mg Tbec (Aspirin) .... Take 1 tablet by mouth once a day 2)  Felodipine 5 Mg Xr24h-tab (Felodipine) .... Take 1 tab by mouth once daily.Marland KitchenMarland Kitchen 3)  Lisinopril 40 Mg Tabs (Lisinopril) .... Take 1 tablet by mouth once a day 4)  Furosemide 40 Mg Tabs (Furosemide) .... Take 1 tablet by mouth once a day 5)  Zetia 10 Mg Tabs (Ezetimibe) .... Take 1 tablet by mouth once a day 6)  Vitamin D 1000 Unit Caps (Cholecalciferol) .... Take 1 cap by mouth once daily.Marland KitchenMarland Kitchen 7)  Etodolac 400 Mg Tabs (Etodolac) .... Take 1 tab by mouth two times a day w/ food as needed for arthritis pain...  Patient Instructions: 1)  It appears you have Vitiligo -loss of pigmentation of the skin.  2)  There is no needed treatment for this  3)  follow up with Dr. Kriste Basque as scheduled.

## 2010-08-06 ENCOUNTER — Other Ambulatory Visit: Payer: Medicare Other

## 2010-08-06 ENCOUNTER — Other Ambulatory Visit: Payer: Self-pay | Admitting: Pulmonary Disease

## 2010-08-06 ENCOUNTER — Ambulatory Visit (INDEPENDENT_AMBULATORY_CARE_PROVIDER_SITE_OTHER): Payer: Medicare Other | Admitting: Pulmonary Disease

## 2010-08-06 ENCOUNTER — Encounter: Payer: Self-pay | Admitting: Pulmonary Disease

## 2010-08-06 DIAGNOSIS — K5730 Diverticulosis of large intestine without perforation or abscess without bleeding: Secondary | ICD-10-CM

## 2010-08-06 DIAGNOSIS — E7800 Pure hypercholesterolemia, unspecified: Secondary | ICD-10-CM

## 2010-08-06 DIAGNOSIS — M949 Disorder of cartilage, unspecified: Secondary | ICD-10-CM

## 2010-08-06 DIAGNOSIS — I10 Essential (primary) hypertension: Secondary | ICD-10-CM

## 2010-08-06 DIAGNOSIS — E559 Vitamin D deficiency, unspecified: Secondary | ICD-10-CM

## 2010-08-06 DIAGNOSIS — I872 Venous insufficiency (chronic) (peripheral): Secondary | ICD-10-CM

## 2010-08-06 DIAGNOSIS — M159 Polyosteoarthritis, unspecified: Secondary | ICD-10-CM

## 2010-08-06 DIAGNOSIS — H4920 Sixth [abducent] nerve palsy, unspecified eye: Secondary | ICD-10-CM

## 2010-08-06 DIAGNOSIS — M899 Disorder of bone, unspecified: Secondary | ICD-10-CM

## 2010-08-06 LAB — HEPATIC FUNCTION PANEL
AST: 17 U/L (ref 0–37)
Albumin: 3.8 g/dL (ref 3.5–5.2)
Alkaline Phosphatase: 66 U/L (ref 39–117)
Bilirubin, Direct: 0.1 mg/dL (ref 0.0–0.3)
Total Bilirubin: 0.5 mg/dL (ref 0.3–1.2)

## 2010-08-06 LAB — LIPID PANEL
Cholesterol: 182 mg/dL (ref 0–200)
Total CHOL/HDL Ratio: 3

## 2010-08-06 LAB — CBC WITH DIFFERENTIAL/PLATELET
Basophils Absolute: 0 10*3/uL (ref 0.0–0.1)
Basophils Relative: 0.5 % (ref 0.0–3.0)
Eosinophils Relative: 5.1 % — ABNORMAL HIGH (ref 0.0–5.0)
Lymphs Abs: 0.8 10*3/uL (ref 0.7–4.0)
MCHC: 33.1 g/dL (ref 30.0–36.0)
Monocytes Absolute: 0.5 10*3/uL (ref 0.1–1.0)
Neutro Abs: 1.8 10*3/uL (ref 1.4–7.7)
Platelets: 234 10*3/uL (ref 150.0–400.0)
RDW: 14.2 % (ref 11.5–14.6)

## 2010-08-06 LAB — BASIC METABOLIC PANEL
BUN: 12 mg/dL (ref 6–23)
CO2: 30 mEq/L (ref 19–32)
Calcium: 9.2 mg/dL (ref 8.4–10.5)
GFR: 83.65 mL/min (ref >60.00)
Glucose, Bld: 92 mg/dL (ref 70–99)
Potassium: 4 mEq/L (ref 3.5–5.1)

## 2010-08-12 NOTE — Assessment & Plan Note (Signed)
Summary: 6 month [facility]   CC:  6 month ROV & review of mult medical problems....  History of Present Illness: 77 y/o WF here for a follow up visit... she has multiple medical problems including HBP, Hyperchol, DJD/ Osteopenia, Vit D defic...   ~  ZOX09:  another good 50mo- c/o some arthritis pain in hips and knees... she has Etodolac Bid which helps, offered Ortho referral but she declines "I don't want shots"... BP controlled, chol = fair on the Zetia + diet, DrLavoie stopped her Actonel for a drug holiday...  ~  Aug11:  she is concerned about "spots" on her neck = vitiligo & she is reassured... also notes some constip but notes that prunes do the job... some intermittent left shoulder blade area discomfort relieved by heat/ Tylenol... BP controlled on meds;  Chol looks good on the Zetia;  otherw stable...   ~  August 06, 2010:  another good 50mo- she has some persistant arthritic complaints re: her knees & we discussed brace, Etodolac rx, etc (she does not want Ortho referral);  also notes some insomnia but is scared of meds- rec to try TylenolPM vs Melatonin... BP remains well controlled on meds;  Chol has looked good on diet + Zetia;  no GI complaints...    Current Problem List:  HEARING LOSS (ICD-389.9) - she saw DrBates Aug09 w/ cerumen impactions removed and improvement in her hearing since then...  HYPERTENSION (ICD-401.9) - on ASA 81mg /d,  FELODIPINE 5mg /d, LISINOPRIL 40mg /d, LASIX 40mg /d... BP= 142/72 today and tolerating meds well... denies HA, fatigue, visual changes, CP, palipit, dizziness, syncope, dyspnea, edema, etc...  ~  Labs including lytes & renal function have been WNL.Marland KitchenMarland Kitchen  VENOUS INSUFFICIENCY (ICD-459.81) - on low salt diet, and Lasix...  HYPERCHOLESTEROLEMIA (ICD-272.0) - on diet and ZETIA 10mg /d...  ~  FLP 6/08 showed TChol 184, TG 86, HDL 54, LDL 113  ~  FLP 7/09 showed TChol 169, TG 65, HDL 50, LDL 106  ~  FLP 7/10 showed TChol 174, TG 67, HDL 54, LDL 107  ~  FLP  2/11 showed TChol 180, TG 74, HDL 64, LDL 101  ~  FLP 8/11 showed TChol 168, TG 65, HDL 53, LDL 102  ~  FLP 2/12 showed TChol 182, TG 70, HDL56, LDL 112  DIVERTICULOSIS OF COLON (ICD-562.10) - she notes occas constipation (uses prunes prn)... last colonoscopy 1/08 by DrPatterson was neg...  DEGENERATIVE JOINT DISEASE, GENERALIZED (ICD-715.00) - uses TYLENOL as needed, and has ETODOLAC 400mg  Prn as well... c/o hip & knee pains- offered Ortho referral but she declines... never tried the knee braces... uses a cane.  OSTEOPENIA (ICD-733.90) - on calcium, multivits, & Vit D... prev on Actonel & this was held by [facility] for a drug holiday (stopped in 2010),,,  ~  BMD 3/10 showed TScores -1.5 in Spine & -1.8 left FemNeck... continue meds.  VITAMIN D DEFICIENCY (ICD-268.9)  ~  labs 7/09 showed Vit D level= 15... rec- start Vit D 50,000 u weekly.  ~  labs 1/10 showed Vit D level = 27... continue 50K.  ~  labs 7/10 showed Vit D level = 48... OK to change to 1000 u daily.  ~  labs 2/11 showed Vit D level = 35  Hx of BELLS PALSY (ICD-351.0) - hx of left Bell's palsy in 1980... resolved w/o residual wkness...  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 Mammogram 10/08 neg.Marland KitchenMarland Kitchen  had TETANUS shot & PNEUMOVAX- given 7/10.   Preventive Screening-Counseling & Management  Alcohol-Tobacco     Smoking Status: never  Allergies (verified): No Known Drug Allergies  Comments:  Nurse/Medical Assistant: The patient's medications and allergies were reviewed with the patient and were updated in the Medication and Allergy Lists.  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 mother died age 99    father died age 54   hx of dm 2 siblings 1 brother alive age 10  hx of dm 1 brother alive age 65---hx of hip replacements  Social History: Reviewed history from 01/25/2010 and no changes required. widowed retired never smoked no etoh 5 adopted children  Review of Systems      See HPI       The patient complains of dyspnea on exertion, peripheral edema, and difficulty walking.  The patient denies anorexia, fever, weight loss, weight gain, vision loss, decreased hearing, hoarseness, chest pain, syncope, prolonged cough, headaches, hemoptysis, abdominal pain, melena, hematochezia, severe indigestion/heartburn, hematuria, incontinence, muscle weakness, suspicious skin lesions, transient blindness, depression, unusual weight change, abnormal bleeding, enlarged lymph nodes, and angioedema.    Vital Signs:  Patient profile:   77 year old female Height:      63.5 inches Weight:      162.38 pounds BMI:     28.42 O2 Sat:      97 % on Room air Temp:     97.8 degrees F oral Pulse rate:   66 / minute BP sitting:   142 / 72  (left arm) Cuff size:   regular  Vitals Entered By: Randell Loop CMA (August 06, 2010 11:10 AM)  O2 Sat at Rest %:  97 O2 Flow:  Room air CC: 6 month ROV & review of mult medical problems... Is Patient Diabetic? No Pain Assessment Patient in pain? yes      Onset of pain  knee pain Comments no changes in meds today   Physical Exam  Additional Exam:  WD, WN, 77 y/o BF in NAD... she is quite remarkable for 43! GENERAL:  Alert & oriented; pleasant & cooperative... HEENT:  [place]/AT, EOM-wnl, PERRLA, EACs- recurrent debris  bilat, TMs- not well seen, NOSE-clear, THROAT-clear & wnl. NECK:  Supple w/ fairROM; no JVD; normal carotid impulses w/o bruits; no thyromegaly or nodules palpated; no lymphadenopathy. CHEST:  Clear to P & A; without wheezes/ rales/ or rhonchi heard... HEART:  Regular Rhythm; gr 1/6 SEM, no rubs or gallops detected... ABDOMEN:  Soft & nontender; normal bowel sounds; no organomegaly or masses palpated... EXT: without deformities, mod arthritic changes; no varicose veins/ +venous insuffic/ tr edema. NEURO:  CN's intact; no focal neuro deficits... DERM:  No lesions noted; no rash etc...    Impression & Recommendations:  Problem # 1:  HYPERTENSION (ICD-401.9) Controlled on meds>  contionue same. Her updated medication list for this problem includes:    Felodipine 5 Mg Xr24h-tab (Felodipine) .Marland Kitchen... Take 1 tab by mouth once daily...    Lisinopril 40 Mg Tabs (Lisinopril) .Marland Kitchen... Take 1 tablet by mouth once a day    Furosemide 40 Mg Tabs (Furosemide) .Marland Kitchen... Take 1  tablet by mouth once a day  Orders: TLB-BMP (Basic Metabolic Panel-BMET) (80048-METABOL) TLB-Hepatic/Liver Function Pnl (80076-HEPATIC) TLB-CBC Platelet - w/Differential (85025-CBCD) TLB-Lipid Panel (80061-LIPID) TLB-TSH (Thyroid Stimulating Hormone) (84443-TSH)  Problem # 2:  VENOUS INSUFFICIENCY (ICD-459.81) Stable on low sodium, elevation, support hose...  Problem # 3:  HYPERCHOLESTEROLEMIA (ICD-272.0) FLP remains reasonable on diet + zetia... Her updated medication list for this problem includes:    Zetia 10 Mg Tabs (Ezetimibe) .Marland Kitchen... Take 1 tablet by mouth once a day  Problem # 4:  DIVERTICULOSIS OF COLON (ICD-562.10) Stable w/o ch in bowel habits, pain, or bl seen...  Problem # 5:  DEGENERATIVE JOINT DISEASE, GENERALIZED (ICD-715.00) Stable on the Etodolac but I have encouraged Ortho eval> she declines & is contenet to continue as is. Her updated medication list for this problem includes:    Adult Aspirin Ec Low Strength  81 Mg Tbec (Aspirin) .Marland Kitchen... Take 1 tablet by mouth once a day    Etodolac 400 Mg Tabs (Etodolac) .Marland Kitchen... Take 1 tab by mouth two times a day w/ food as needed for arthritis pain...  Problem # 6:  OSTEOPENIA (ICD-733.90) BMD followed by GYN, DrLaVoie & she remains off Bisphos (on drug holiday)... continue Calcium, MVI, Vit D.  Problem # 7:  OTHER MEDICAL PROBLEMS AS NOTED>>>  Complete Medication List: 1)  Adult Aspirin Ec Low Strength 81 Mg Tbec (Aspirin) .... Take 1 tablet by mouth once a day 2)  Felodipine 5 Mg Xr24h-tab (Felodipine) .... Take 1 tab by mouth once daily.Marland KitchenMarland Kitchen 3)  Lisinopril 40 Mg Tabs (Lisinopril) .... Take 1 tablet by mouth once a day 4)  Furosemide 40 Mg Tabs (Furosemide) .... Take 1 tablet by mouth once a day 5)  Zetia 10 Mg Tabs (Ezetimibe) .... Take 1 tablet by mouth once a day 6)  Etodolac 400 Mg Tabs (Etodolac) .... Take 1 tab by mouth two times a day w/ food as needed for arthritis pain.Marland KitchenMarland Kitchen 7)  Oscal 500/200 D-3 500-200 Mg-unit Tabs (Calcium carbonate-vitamin d) .... Take 1 tab by mouth once daily.Marland KitchenMarland Kitchen 8)  Womens Multivitamin Plus Tabs (Multiple vitamins-minerals) .... Take 1 tab by mouth once daily.Marland KitchenMarland Kitchen 9)  Vitamin D 1000 Unit Caps (Cholecalciferol) .... Take 1 cap by mouth once daily...  Patient Instructions: 1)  Today we updated your med list- see below.... 2)  We refilled your perscriptions for 2012... 3)  Today we did your follow up FASTING blood work... please call the "phone tree" in a few days for your lab results.Marland KitchenMarland Kitchen 4)  For your INSOMNIA:  try OTC TYLENOL PM, BENEDRYL, or MELATONIN to help you rest... if you need something stronger by perscription let me know... 5)  Stay as active as poss & BE CAREFUL... 6)  Call for any problems.Marland KitchenMarland Kitchen 7)  Please schedule a follow-up appointment in 6 months. Prescriptions: ETODOLAC 400 MG  TABS (ETODOLAC) take 1 tab by mouth two times a day w/ food as needed for arthritis pain...  #60 x 12   Entered and Authorized by:   [name]  MD   Signed by:   [name] MD on 08/06/2010   Method used:   Print then Give to Patient   RxID:   [id number] ZETIA 10 MG  TABS (EZETIMIBE) Take 1 tablet by mouth once a day  #30 x 12   Entered and Authorized by:   [name] MD   Signed by:   [name] MD on 08/06/2010   Method used:   Print  then Give to Patient   RxID:   [id number] FUROSEMIDE 40 MG  TABS (FUROSEMIDE) Take 1 tablet by mouth once a day  #30 x 12   Entered and Authorized by:   [name] MD   Signed by:   [name] MD on 08/06/2010   Method used:   Print then Give to Patient   RxID:   [id number] LISINOPRIL 40 MG  TABS (LISINOPRIL) Take 1 tablet by mouth once a day  #30 x 12   Entered and Authorized by:   [name] MD   Signed by:   [name] MD on 08/06/2010   Method used:   Print then Give to Patient   RxID:   [id number] FELODIPINE 5 MG XR24H-TAB (FELODIPINE) take 1 tab by mouth once daily...  #30 x 12   Entered and Authorized by:   [name] MD   Signed by:   [name] MD on 08/06/2010   Method used:   Print then Give to Patient   RxID:   [id number]

## 2010-10-11 ENCOUNTER — Telehealth: Payer: Self-pay | Admitting: Pulmonary Disease

## 2010-10-11 MED ORDER — DICLOFENAC SODIUM 75 MG PO TBEC: DELAYED_RELEASE_TABLET | ORAL | Status: DC

## 2010-10-11 NOTE — Telephone Encounter (Signed)
Spoke w/ pt and she c/o of right sided hip pain and it only hurts when she walks. Pt rates the pain 9/10 and this started 1 week ago. Pt states she is not sure if she hit it on something or what happened. Pt has not tried anything b/c she states she didn't know what to try. Please advise Dr. Kriste Basque recs for pt. Thanks  KNDA  Carver Fila, CMA

## 2010-10-11 NOTE — Telephone Encounter (Signed)
PT aware rx was called into pharmacy as well as directions for medication. Pt verbalized understanding

## 2010-10-11 NOTE — Telephone Encounter (Signed)
Per SN--recs for diclofenac 75mg     #60 with 2 refills.   Take one tablet by mouth two times daily with a meal for arthritis pain.  thanks

## 2010-11-12 NOTE — Assessment & Plan Note (Signed)
[facility]                         GASTROENTEROLOGY OFFICE NOTE   COURTLYNN, HOLLOMAN                       MRN:          [id number]  DATE:05/29/2006                            DOB:          12-16-1921    REASON FOR CONSULTATION:  Colorectal cancer screening.  [name] is a  pleasant 77 year old African-American female referred through the  courtesy of Dr. Seymour Bars for evaluation.  She has a history of  diverticulosis and was last examined in 2001.  Mrs. Ollinger only GI  complaint is occasional constipation.  There is no history of change in  bowel habits, melena, or hematochezia.   PAST MEDICAL HISTORY:  Pertinent for arthritis.   FAMILY HISTORY:  Noncontributory.   MEDICATIONS:  Include Protegra, calcium, Lasix, Actonel, lisinopril,  felodipine, Zetia, and etodolac.   She has no allergies.   She neither smokes nor drinks.  She is widowed and retired.   REVIEW OF SYSTEMS:  Positive for arthritis and sleeping problems.   EXAM:  Pulse 55, blood pressure 128/64, weight 172.  HEENT:  EOMI. PERRLA. Sclerae are anicteric.  Conjunctivae are pink.  NECK:  Supple without thyromegaly, adenopathy or carotid bruits.  CHEST:  Clear to auscultation and percussion without adventitious  sounds.  CARDIAC:  Regular rhythm; normal S1 S2.  There are no murmurs, gallops  or rubs.  ABDOMEN:  Bowel sounds are normoactive.  Abdomen is soft, non-tender and  non-distended.  There are no abdominal masses, tenderness, splenic  enlargement or hepatomegaly.  EXTREMITIES:  Full range of motion.  No cyanosis, clubbing or edema.  RECTAL:  Deferred.   IMPRESSION:  History of diverticulosis.   RECOMMENDATION:  Screening colonoscopy.     Barbette Hair. Arlyce Dice, MD,FACG  Electronically Signed    RDK/MedQ  DD: 05/29/2006  DT: 05/29/2006  Job #: [id number]   cc:   Genia Del, M.D.

## 2010-11-12 NOTE — Letter (Signed)
May 29, 2006    Genia Del, M.D.  [address]  [place], [place] [address]   RE:  Coleman, Angela  MRN:  [id number]  /  DOB:  1922/04/08   Dear Dr. Seymour Bars:   Upon your kind referral, I had the pleasure of evaluating your patient  and I am pleased to offer my findings.  I saw Emmory Solivan in the office  today.  Enclosed is a copy of my progress note that details my findings  and recommendations.   Thank you for the opportunity to participate in your patient's care.    Sincerely,      Barbette Hair. Arlyce Dice, MD,FACG  Electronically Signed    RDK/MedQ  DD: 05/29/2006  DT: 05/29/2006  Job #: [id number]

## 2011-02-04 ENCOUNTER — Encounter: Payer: Self-pay | Admitting: Pulmonary Disease

## 2011-02-04 ENCOUNTER — Ambulatory Visit (INDEPENDENT_AMBULATORY_CARE_PROVIDER_SITE_OTHER): Payer: Medicare Other | Admitting: Pulmonary Disease

## 2011-02-04 DIAGNOSIS — M899 Disorder of bone, unspecified: Secondary | ICD-10-CM

## 2011-02-04 DIAGNOSIS — E7800 Pure hypercholesterolemia, unspecified: Secondary | ICD-10-CM

## 2011-02-04 DIAGNOSIS — M949 Disorder of cartilage, unspecified: Secondary | ICD-10-CM

## 2011-02-04 DIAGNOSIS — E559 Vitamin D deficiency, unspecified: Secondary | ICD-10-CM

## 2011-02-04 DIAGNOSIS — I10 Essential (primary) hypertension: Secondary | ICD-10-CM

## 2011-02-04 DIAGNOSIS — M159 Polyosteoarthritis, unspecified: Secondary | ICD-10-CM

## 2011-02-04 DIAGNOSIS — K5730 Diverticulosis of large intestine without perforation or abscess without bleeding: Secondary | ICD-10-CM

## 2011-02-04 DIAGNOSIS — I872 Venous insufficiency (chronic) (peripheral): Secondary | ICD-10-CM

## 2011-02-04 NOTE — Progress Notes (Signed)
Subjective:    Patient ID: Angela Coleman, female    DOB: 1921-10-21, 77 y.o.   MRN: [id number]  HPI 77 y/o WF here for a follow up visit... she has multiple medical problems including HBP, Hyperchol, DJD/ Osteopenia, Vit D defic...  ~  [id number]:  another good 1mo- c/o some arthritis pain in hips and knees... she has Etodolac Bid which helps, offered Ortho referral but she declines "I don't want shots"... BP controlled, chol = fair on the Zetia + diet, DrLavoie stopped her Actonel for a drug holiday... ~  Aug11:  she is concerned about "spots" on her neck = vitiligo & she is reassured... also notes some constip but notes that prunes do the job... some intermittent left shoulder blade area discomfort relieved by heat/ Tylenol... BP controlled on meds;  Chol looks good on the Zetia;  otherw stable...  ~  August 06, 2010:  another good 1mo- she has some persistant arthritic complaints re: her knees & we discussed brace, Etodolac rx, etc (she does not want Ortho referral);  also notes some insomnia but is scared of meds- rec to try TylenolPM vs Melatonin... BP remains well controlled on meds;  Chol has looked good on diet + Zetia;  no GI complaints...  ~  February 04, 2011:  1mo ROV & she is taking Voltaren Bid for her arthritis symptoms (it helps); otherw doing satis w/o other complaints or concerns...    BP controlled on Felodipine, Lisinopril, Lasix w/ BP= 128/80 today; denies CP, palpit, dizzy, ch in DOE, edema, etc; Chol has been ok on Zetia alone & prev labs reviewed w/ pt; she is remarkably stable for 77 y/o...   Current Problem List:  HEARING LOSS (ICD-389.9) - she saw DrBates Aug09 w/ cerumen impactions removed and improvement in her hearing since then...  HYPERTENSION (ICD-401.9) - on ASA 81mg /d,  FELODIPINE 5mg /d, LISINOPRIL 40mg /d, LASIX 40mg /d... ~  Labs including lytes & renal function have been WNL.Marland KitchenMarland Kitchen  VENOUS INSUFFICIENCY (ICD-459.81) - on low salt diet, and  Lasix...  HYPERCHOLESTEROLEMIA (ICD-272.0) - on diet and ZETIA 10mg /d... ~  FLP 6/08 showed TChol 184, TG 86, HDL 54, LDL 113 ~  FLP 7/09 showed TChol 169, TG 65, HDL 50, LDL 106 ~  FLP 7/10 showed TChol 174, TG 67, HDL 54, LDL 107 ~  FLP 2/11 showed TChol 180, TG 74, HDL 64, LDL 101 ~  FLP 8/11 showed TChol 168, TG 65, HDL 53, LDL 102 ~  FLP 2/12 showed TChol 182, TG 70, HDL56, LDL 112  DIVERTICULOSIS OF COLON (ICD-562.10) - she notes occas constipation (uses prunes prn)... last colonoscopy 1/08 by DrPatterson was neg...  DEGENERATIVE JOINT DISEASE, GENERALIZED (ICD-715.00) - uses TYLENOL as needed, and has VOLTAREN 75mg  Prn as well... c/o hip & knee pains- offered Ortho referral but she declines... never tried the knee braces... uses a cane.  OSTEOPENIA (ICD-733.90) - on calcium, multivits, & Vit D... prev on Actonel & this was held by [employer] for a drug holiday (stopped in 2010),,, ~  BMD 3/10 showed TScores -1.5 in Spine & -1.8 left FemNeck... continue meds.  VITAMIN D DEFICIENCY (ICD-268.9) ~  labs 7/09 showed Vit D level= 15... rec- start Vit D 50,000 u weekly. ~  labs 1/10 showed Vit D level = 27... continue 50K. ~  labs 7/10 showed Vit D level = 48... OK to change to 1000 u daily. ~  labs 2/11 showed Vit D level = 35  Hx of BELLS PALSY (ICD-351.0) -  hx of left Bell's palsy in 1980... resolved w/o residual wkness...  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 Mammogram 10/08 neg... had TETANUS shot & PNEUMOVAX- given 7/10.   Past Surgical History  Procedure Date  . Vesicovaginal fistula closure w/ tah 1974  . Cataract extraction     Outpatient Encounter Prescriptions as of 02/04/2011  Medication Sig Dispense Refill  . aspirin 81 MG tablet Take 81 mg by mouth daily.        . calcium-vitamin D (OSCAL WITH D) 500-200 MG-UNIT per tablet Take 1 tablet by mouth  daily.        . diclofenac (VOLTAREN) 75 MG EC tablet Take 1 tablet by mouth twice a day with food for the arthritis pain  60 tablet  2  . etodolac (LODINE) 400 MG tablet Take 400 mg by mouth 2 (two) times daily.        Marland Kitchen ezetimibe (ZETIA) 10 MG tablet Take 10 mg by mouth daily.        . felodipine (PLENDIL) 5 MG 24 hr tablet Take 5 mg by mouth daily.        . furosemide (LASIX) 40 MG tablet Take 40 mg by mouth daily.        Marland Kitchen lisinopril (PRINIVIL,ZESTRIL) 40 MG tablet Take 40 mg by mouth daily.        . Cholecalciferol (VITAMIN D) 1000 UNITS capsule Take 1,000 Units by mouth daily.        . Multiple Vitamins-Minerals (WOMENS MULTIVITAMIN PLUS) TABS Take 1 tablet by mouth daily.          No Known Allergies   Current Medications, Allergies, Past Medical History, Past Surgical History, Family History, and Social History were reviewed in [employer] record.    Review of Systems       See HPI - all other systems neg except as noted...  The patient complains of dyspnea on exertion, peripheral edema, and difficulty walking.  The patient denies anorexia, fever, weight loss, weight gain, vision loss, decreased hearing, hoarseness, chest pain, syncope, prolonged cough, headaches, hemoptysis, abdominal pain, melena, hematochezia, severe indigestion/heartburn, hematuria, incontinence, muscle weakness, suspicious skin lesions, transient blindness, depression, unusual weight change, abnormal bleeding, enlarged lymph nodes, and angioedema.     Objective:   Physical Exam    WD, WN, 77 y/o BF in NAD... she is quite remarkable for 48! GENERAL:  Alert & oriented; pleasant & cooperative... HEENT:  [place]/AT, EOM-wnl, PERRLA, EACs- recurrent debris bilat, TMs- not well seen, NOSE-clear, THROAT-clear & wnl. NECK:  Supple w/ fairROM; no JVD; normal carotid impulses w/o bruits; no thyromegaly or nodules palpated; no lymphadenopathy. CHEST:  Clear to P & A; without wheezes/ rales/ or rhonchi  heard... HEART:  Regular Rhythm; gr 1/6 SEM, no rubs or gallops detected... ABDOMEN:  Soft & nontender; normal bowel sounds; no organomegaly or masses palpated... EXT: without deformities, mod arthritic changes; no varicose veins/ +venous insuffic/ tr edema. NEURO:  CN's intact; no focal neuro deficits... DERM:  No lesions noted; no rash etc...   Assessment & Plan:   HBP>  Controlled on Felodipine, Lisinopril, Lasix; continue same + low sodium diet etc...  CHOL>  Reasonably well controlled on Zetia; doesn't want statin Rx; continue same + diet- low chol, low fat...  GI> Divertics> stable, doing well, last colonoscopy was 2008 & neg...  ORTHO> DJD>  Voltaren + OTC Tylenol works  for her, she is offered ortho referral but he declines...  Osteopenia>  On Calcium, MVI, Vit D; prev on Actonel but GYN DrLaVoie held it for drug holiday starting in 2010...  Other medical issues as noted.Marland KitchenMarland Kitchen

## 2011-02-04 NOTE — Patient Instructions (Signed)
Today we updated your med list in EPIC...  Continue your current meds the same...  Call for any problems...  Let's plan a follow up visit in 6 months w/ FASTING blood work at that time.Marland KitchenMarland Kitchen

## 2011-02-13 ENCOUNTER — Encounter: Payer: Self-pay | Admitting: Pulmonary Disease

## 2011-05-03 ENCOUNTER — Ambulatory Visit (INDEPENDENT_AMBULATORY_CARE_PROVIDER_SITE_OTHER): Payer: Medicare Other

## 2011-05-03 DIAGNOSIS — Z23 Encounter for immunization: Secondary | ICD-10-CM

## 2011-05-12 ENCOUNTER — Other Ambulatory Visit: Payer: Self-pay | Admitting: *Deleted

## 2011-05-12 MED ORDER — FELODIPINE ER 5 MG PO TB24: 5.0000 mg | ORAL_TABLET | Freq: Every day | ORAL | Status: DC

## 2011-08-08 ENCOUNTER — Ambulatory Visit (INDEPENDENT_AMBULATORY_CARE_PROVIDER_SITE_OTHER): Payer: Medicare Other | Admitting: Pulmonary Disease

## 2011-08-08 ENCOUNTER — Other Ambulatory Visit (INDEPENDENT_AMBULATORY_CARE_PROVIDER_SITE_OTHER): Payer: Medicare Other

## 2011-08-08 ENCOUNTER — Encounter: Payer: Self-pay | Admitting: Pulmonary Disease

## 2011-08-08 ENCOUNTER — Ambulatory Visit (INDEPENDENT_AMBULATORY_CARE_PROVIDER_SITE_OTHER)
Admission: RE | Admit: 2011-08-08 | Discharge: 2011-08-08 | Disposition: A | Discharge status: Home / Self Care | Payer: Medicare Other | Source: Ambulatory Visit | Attending: Pulmonary Disease | Admitting: Pulmonary Disease

## 2011-08-08 DIAGNOSIS — E7800 Pure hypercholesterolemia, unspecified: Secondary | ICD-10-CM

## 2011-08-08 DIAGNOSIS — K5730 Diverticulosis of large intestine without perforation or abscess without bleeding: Secondary | ICD-10-CM

## 2011-08-08 DIAGNOSIS — I10 Essential (primary) hypertension: Secondary | ICD-10-CM

## 2011-08-08 DIAGNOSIS — E559 Vitamin D deficiency, unspecified: Secondary | ICD-10-CM

## 2011-08-08 DIAGNOSIS — I872 Venous insufficiency (chronic) (peripheral): Secondary | ICD-10-CM

## 2011-08-08 DIAGNOSIS — F411 Generalized anxiety disorder: Secondary | ICD-10-CM

## 2011-08-08 DIAGNOSIS — F419 Anxiety disorder, unspecified: Secondary | ICD-10-CM

## 2011-08-08 DIAGNOSIS — M949 Disorder of cartilage, unspecified: Secondary | ICD-10-CM

## 2011-08-08 DIAGNOSIS — M159 Polyosteoarthritis, unspecified: Secondary | ICD-10-CM

## 2011-08-08 DIAGNOSIS — M899 Disorder of bone, unspecified: Secondary | ICD-10-CM

## 2011-08-08 LAB — LIPID PANEL
Cholesterol: 186 mg/dL (ref 0–200)
LDL Cholesterol: 110 mg/dL — ABNORMAL HIGH (ref 0–99)
Total CHOL/HDL Ratio: 3
Triglycerides: 68 mg/dL (ref 0.0–149.0)

## 2011-08-08 LAB — BASIC METABOLIC PANEL
CO2: 31 mEq/L (ref 19–32)
Chloride: 104 mEq/L (ref 96–112)
GFR: 79.04 mL/min (ref >60.00)
Potassium: 4.4 mEq/L (ref 3.5–5.1)
Sodium: 140 mEq/L (ref 135–145)

## 2011-08-08 LAB — CBC WITH DIFFERENTIAL/PLATELET
Basophils Relative: 1.4 % (ref 0.0–3.0)
Eosinophils Absolute: 0.2 10*3/uL (ref 0.0–0.7)
Eosinophils Relative: 4.8 % (ref 0.0–5.0)
Hemoglobin: 12.8 g/dL (ref 12.0–15.0)
Lymphocytes Relative: 17.7 % (ref 12.0–46.0)
MCHC: 33.9 g/dL (ref 30.0–36.0)
MCV: 82.5 fl (ref 78.0–100.0)
Platelets: 231 10*3/uL (ref 150.0–400.0)
RBC: 4.56 Mil/uL (ref 3.87–5.11)

## 2011-08-08 LAB — HEPATIC FUNCTION PANEL
ALT: 10 U/L (ref 0–35)
Albumin: 3.9 g/dL (ref 3.5–5.2)
Alkaline Phosphatase: 64 U/L (ref 39–117)
Bilirubin, Direct: 0.1 mg/dL (ref 0.0–0.3)
Total Bilirubin: 0.3 mg/dL (ref 0.3–1.2)
Total Protein: 7.5 g/dL (ref 6.0–8.3)

## 2011-08-08 NOTE — Progress Notes (Signed)
Subjective:    Patient ID: Angela Coleman, female    DOB: 1922/03/07, 78 y.o.   MRN: [id number]  HPI 78 y/o WF here for a follow up visit... she has multiple medical problems including HBP, Hyperchol, DJD/ Osteopenia, Vit D defic...  ~  August 06, 2010:  another good 87mo- she has some persistant arthritic complaints re: her knees & we discussed brace, Etodolac rx, etc (she does not want Ortho referral);  also notes some insomnia but is scared of meds- rec to try TylenolPM vs Melatonin... BP remains well controlled on meds;  Chol has looked good on diet + Zetia;  no GI complaints...  ~  February 04, 2011:  87mo ROV & she is taking Voltaren Bid for her arthritis symptoms (it helps); otherw doing satis w/o other complaints or concerns...    BP controlled on Felodipine, Lisinopril, Lasix w/ BP= 128/80 today; denies CP, palpit, dizzy, ch in DOE, edema, etc; Chol has been ok on Zetia alone & prev labs reviewed w/ pt; she is remarkably stable for 78 y/o...  ~  August 08, 2011:  87mo ROV & she continues to do well> no new complaints or concerns...    HBP> on Felod5, Lisin40, Furosemide40; BP= 148/78 & denies CP, palpit, SOB; ut is occas dizzy & has 1+edema...    Chol> on Zetia10; FLP showed TChol 186, TG 68, HDL 62, LDL 110; keep same meds & better diet...    DJD> on Voltaren, Tylenol as needed...     Osteopenia> BMD & meds followed by Marlane Hatcher per pt; they stopped her Actonel in 2011 for a drug holiday; Vit D is still low & dose of supplement increased. CXR 2/13 showed normal heart size, clear lungs, DJD in spine w/ scolosis... LABS 2/13 showed FLP- looks good on Zetia10;  Chems- wnl;  CBC- wnl;  TSH=1.97;  VitD=26 on 1000u/d & needs incr to 2000u/d.   PROBLEM LIST:    HEARING LOSS (ICD-389.9) - she saw DrBates Aug09 w/ cerumen impactions removed and improvement in her hearing since then...  HYPERTENSION (ICD-401.9) - on ASA 81mg /d,  FELODIPINE 5mg /d, LISINOPRIL 40mg /d, LASIX 40mg /d... ~   Labs including lytes & renal function have been WNL.Marland KitchenMarland Kitchen  VENOUS INSUFFICIENCY (ICD-459.81) - on low salt diet, and Lasix...  HYPERCHOLESTEROLEMIA (ICD-272.0) - on diet and ZETIA 10mg /d... ~  FLP 6/08 showed TChol 184, TG 86, HDL 54, LDL 113 ~  FLP 7/09 showed TChol 169, TG 65, HDL 50, LDL 106 ~  FLP 7/10 showed TChol 174, TG 67, HDL 54, LDL 107 ~  FLP 2/11 showed TChol 180, TG 74, HDL 64, LDL 101 ~  FLP 8/11 showed TChol 168, TG 65, HDL 53, LDL 102 ~  FLP 2/12 showed TChol 182, TG 70, HDL56, LDL 112 ~  FLP 2/13 on Zetia10 showed TChol 186, TG 68, HDL 62, LDL 110  DIVERTICULOSIS OF COLON (ICD-562.10) - she notes occas constipation (uses prunes prn)... last colonoscopy 1/08 by DrPatterson was neg...  DEGENERATIVE JOINT DISEASE, GENERALIZED (ICD-715.00) - uses TYLENOL as needed, and has VOLTAREN 75mg  Prn as well... c/o hip & knee pains- offered Ortho referral but she declines... never tried the knee braces... uses a cane.  OSTEOPENIA (ICD-733.90) - on calcium, multivits, & Vit D... prev on Actonel & this was held by [facility] for a drug holiday (stopped in ?2010),,, ~  BMD 3/10 showed TScores -1.5 in Spine & -1.8 left FemNeck... continue meds.  VITAMIN D DEFICIENCY (ICD-268.9) ~  labs 7/09 showed  Vit D level= 15... rec- start Vit D 50,000 u weekly. ~  labs 1/10 showed Vit D level = 27... continue 50K. ~  labs 7/10 showed Vit D level = 48... OK to change to 1000 u daily. ~  labs 2/11 showed Vit D level = 35 ~  Labs 2/13 showed Vit D level = 24... rec to incr to 2000u daily...  Hx of BELLS PALSY (ICD-351.0) - hx of left Bell's palsy in 1980... resolved w/o residual wkness...  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 Mammogram 10/08 neg... had TETANUS shot & PNEUMOVAX- given 7/10.   Past Surgical History  Procedure Date  . Vesicovaginal fistula closure w/ tah 1974  .  Cataract extraction     Outpatient Encounter Prescriptions as of 08/08/2011  Medication Sig Dispense Refill  . aspirin 81 MG tablet Take 81 mg by mouth daily.        . calcium-vitamin D (OSCAL WITH D) 500-200 MG-UNIT per tablet Take 1 tablet by mouth daily.        . Cholecalciferol (VITAMIN D) 1000 UNITS capsule Take 1,000 Units by mouth daily.        . diclofenac (VOLTAREN) 75 MG EC tablet Take 1 tablet by mouth twice a day with food for the arthritis pain  60 tablet  2  . ezetimibe (ZETIA) 10 MG tablet Take 10 mg by mouth daily.        . felodipine (PLENDIL) 5 MG 24 hr tablet Take 1 tablet (5 mg total) by mouth daily.  30 tablet  3  . furosemide (LASIX) 40 MG tablet Take 40 mg by mouth daily.        Marland Kitchen lisinopril (PRINIVIL,ZESTRIL) 40 MG tablet Take 40 mg by mouth daily.        . Multiple Vitamins-Minerals (WOMENS MULTIVITAMIN PLUS) TABS Take 1 tablet by mouth daily.          No Known Allergies   Current Medications, Allergies, Past Medical History, Past Surgical History, Family History, and Social History were reviewed in [employer] record.    Review of Systems       See HPI - all other systems neg except as noted...  The patient complains of dyspnea on exertion, peripheral edema, and difficulty walking.  The patient denies anorexia, fever, weight loss, weight gain, vision loss, decreased hearing, hoarseness, chest pain, syncope, prolonged cough, headaches, hemoptysis, abdominal pain, melena, hematochezia, severe indigestion/heartburn, hematuria, incontinence, muscle weakness, suspicious skin lesions, transient blindness, depression, unusual weight change, abnormal bleeding, enlarged lymph nodes, and angioedema.     Objective:   Physical Exam    WD, WN, 78 y/o BF in NAD... she is quite remarkable for 51! GENERAL:  Alert & oriented; pleasant & cooperative... HEENT:  [place]/AT, EOM-wnl, PERRLA, EACs- recurrent debris bilat, TMs- not well seen, NOSE-clear,  THROAT-clear & wnl. NECK:  Supple w/ fairROM; no JVD; normal carotid impulses w/o bruits; no thyromegaly or nodules palpated; no lymphadenopathy. CHEST:  Clear to P & A; without wheezes/ rales/ or rhonchi heard... HEART:  Regular Rhythm; gr 1/6 SEM, no rubs or gallops detected... ABDOMEN:  Soft & nontender; normal bowel sounds; no organomegaly or masses palpated... EXT: without deformities, mod arthritic changes; no varicose veins/ +venous insuffic/ tr edema. NEURO:  CN's intact; no focal neuro deficits... DERM:  No lesions noted; no rash etc...  RADIOLOGY DATA:  Reviewed in the EPIC EMR &  discussed w/ the patient...    >>CXR 2/13 showed normal heart size, clear lungs, DJD in spine w/ scolosis...  LABORATORY DATA:  Reviewed in the EPIC EMR & discussed w/ the patient...    >>LABS 2/13 showed FLP- looks good on Zetia10;  Chems- wnl;  CBC- wnl;  TSH=1.97;  VitD=26 on 1000u/d & needs incr to 2000u/d.    Assessment & Plan:   HBP>  Controlled on Felodipine, Lisinopril, Lasix; continue same + low sodium diet etc...  CHOL>  Reasonably well controlled on Zetia; doesn't want statin Rx; continue same + diet- low chol, low fat...  GI> Divertics> stable, doing well, last colonoscopy was 2008 & neg...  ORTHO> DJD>  Voltaren + OTC Tylenol works for her, she is offered ortho referral but he declines...  Osteopenia>  On Calcium, MVI, Vit D; prev on Actonel but GYN DrLaVoie held it for drug holiday starting in 2010...  Other medical issues as noted...   Patient's Medications  New Prescriptions   No medications on file  Previous Medications   ASPIRIN 81 MG TABLET    Take 81 mg by mouth daily.     CALCIUM-VITAMIN D (OSCAL WITH D) 500-200 MG-UNIT PER TABLET    Take 1 tablet by mouth daily.     CHOLECALCIFEROL (VITAMIN D) 1000 UNITS CAPSULE    Take 1,000 Units by mouth daily.     DICLOFENAC (VOLTAREN) 75 MG EC TABLET    Take 1 tablet by mouth twice a day with food for the arthritis pain   EZETIMIBE  (ZETIA) 10 MG TABLET    Take 10 mg by mouth daily.     FELODIPINE (PLENDIL) 5 MG 24 HR TABLET    Take 1 tablet (5 mg total) by mouth daily.   FUROSEMIDE (LASIX) 40 MG TABLET    Take 40 mg by mouth daily.     LISINOPRIL (PRINIVIL,ZESTRIL) 40 MG TABLET    Take 40 mg by mouth daily.     MULTIPLE VITAMINS-MINERALS (WOMENS MULTIVITAMIN PLUS) TABS    Take 1 tablet by mouth daily.    Modified Medications   No medications on file  Discontinued Medications   No medications on file

## 2011-08-08 NOTE — Patient Instructions (Signed)
Today we updated your med list in our EPIC system...    Continue your current medications the same...  Today we did your follow up CXR & fasting blood work...    Please call the PHONE TREE in a few days for your results...    Dial [id number] & when prompted enter your patient number followed by the # symbol...    Your patient number is:  [id number]#  Stay as active as possible, and BE SAFE...  Call for any questions...  Let's plan a 6 month follow up recheck.Marland KitchenMarland Kitchen

## 2011-09-08 ENCOUNTER — Other Ambulatory Visit: Payer: Self-pay | Admitting: *Deleted

## 2011-09-08 MED ORDER — LISINOPRIL 40 MG PO TABS: 40.0000 mg | ORAL_TABLET | Freq: Every day | ORAL | Status: DC

## 2011-10-28 ENCOUNTER — Other Ambulatory Visit: Payer: Self-pay | Admitting: Pulmonary Disease

## 2011-10-28 MED ORDER — FUROSEMIDE 40 MG PO TABS: 40.0000 mg | ORAL_TABLET | Freq: Every day | ORAL | Status: DC

## 2011-12-28 ENCOUNTER — Other Ambulatory Visit: Payer: Self-pay | Admitting: Pulmonary Disease

## 2011-12-28 MED ORDER — EZETIMIBE 10 MG PO TABS: 10.0000 mg | ORAL_TABLET | Freq: Every day | ORAL | Status: DC

## 2012-02-06 ENCOUNTER — Ambulatory Visit (INDEPENDENT_AMBULATORY_CARE_PROVIDER_SITE_OTHER): Payer: Self-pay | Admitting: Pulmonary Disease

## 2012-02-06 ENCOUNTER — Encounter: Payer: Self-pay | Admitting: Pulmonary Disease

## 2012-02-06 VITALS — BP 160/84 | HR 50 | Temp 97.2°F | Ht 63.5 in | Wt 156.4 lb

## 2012-02-06 DIAGNOSIS — E559 Vitamin D deficiency, unspecified: Secondary | ICD-10-CM

## 2012-02-06 DIAGNOSIS — I10 Essential (primary) hypertension: Secondary | ICD-10-CM

## 2012-02-06 DIAGNOSIS — M159 Polyosteoarthritis, unspecified: Secondary | ICD-10-CM

## 2012-02-06 DIAGNOSIS — E7800 Pure hypercholesterolemia, unspecified: Secondary | ICD-10-CM

## 2012-02-06 DIAGNOSIS — I872 Venous insufficiency (chronic) (peripheral): Secondary | ICD-10-CM

## 2012-02-06 DIAGNOSIS — M899 Disorder of bone, unspecified: Secondary | ICD-10-CM

## 2012-02-06 DIAGNOSIS — K5730 Diverticulosis of large intestine without perforation or abscess without bleeding: Secondary | ICD-10-CM

## 2012-02-06 NOTE — Progress Notes (Signed)
Subjective:    Patient ID: Angela Coleman, female    DOB: 1922-04-06, 78 y.o.   MRN: [id number]  HPI 78 y/o WF here for a follow up visit... she has multiple medical problems including HBP, Hyperchol, DJD/ Osteopenia, Vit D defic...  ~  August 06, 2010:  another good 50mo- she has some persistant arthritic complaints re: her knees & we discussed brace, Etodolac rx, etc (she does not want Ortho referral);  also notes some insomnia but is scared of meds- rec to try TylenolPM vs Melatonin... BP remains well controlled on meds;  Chol has looked good on diet + Zetia;  no GI complaints...  ~  February 04, 2011:  50mo ROV & she is taking Voltaren Bid for her arthritis symptoms (it helps); otherw doing satis w/o other complaints or concerns...    BP controlled on Felodipine, Lisinopril, Lasix w/ BP= 128/80 today; denies CP, palpit, dizzy, ch in DOE, edema, etc; Chol has been ok on Zetia alone & prev labs reviewed w/ pt; she is remarkably stable for 78 y/o...  ~  August 08, 2011:  50mo ROV & she continues to do well> no new complaints or concerns...    HBP> on Felod5, Lisin40, Furosemide40; BP= 148/78 & denies CP, palpit, SOB; ut is occas dizzy & has 1+edema...    Chol> on Zetia10; FLP showed TChol 186, TG 68, HDL 62, LDL 110; keep same meds & better diet...    DJD> on Voltaren, Tylenol as needed...     Osteopenia> BMD & meds followed by Marlane Hatcher per pt; they stopped her Actonel in 2011 for a drug holiday; Vit D is still low & dose of supplement increased. CXR 2/13 showed normal heart size, clear lungs, DJD in spine w/ scolosis... LABS 2/13 showed FLP- looks good on Zetia10;  Chems- wnl;  CBC- wnl;  TSH=1.97;  VitD=26 on 1000u/d & needs incr to 2000u/d.  ~  February 06, 2012:  50mo ROV & Angela Coleman is feeling well- no new complaints or concerns; specifically she denies HA, CP, palpit, dizzy, SOB, incr in edema...    She has HBP on Plendil5, Lisinopril40 & Lasix40- states she's taking all 3 every day; call to  her Pharm confirms that she is NOT taking meds regularly & last filled Lasix#30 10/29/11, & last filled Plendil#30 & Lisinopril#30 on 11/29/11; there is some indication from our front office that she was scammed & is currently without insurance coverage?  Advised to restart all 3 BP meds daily...    We reviewed low sodium diet, low fat restriction, and incr exercise program... She has Voltaren 75mg  to take as needed for arthritis pain...    We reviewed prob list, meds, xrays and labs> see below for updates >>    PROBLEM LIST:    HEARING LOSS (ICD-389.9) - she saw DrBates Aug09 w/ cerumen impactions removed and improvement in her hearing since then...  HYPERTENSION (ICD-401.9) - on ASA 81mg /d,  FELODIPINE 5mg /d, LISINOPRIL 40mg /d, LASIX 40mg /d... ~  8/12:  BP controlled on Felodipine, Lisinopril, Lasix w/ BP= 128/80 today; denies CP, palpit, dizzy, ch in DOE, edema, etc ~  2/13:  on Felod5, Lisin40, Furosemide40; BP= 148/78 & denies CP, palpit, SOB; ut is occas dizzy & has 1+edema... ~  8/13:  BP= 16084 but Pharm confirms that she not taking her meds regularly; Rx only cost her $2.65 per med & she is asked to fill Rx monthly & take pills daily...  VENOUS INSUFFICIENCY (ICD-459.81) - on low salt diet,  and Lasix...  HYPERCHOLESTEROLEMIA (ICD-272.0) - on diet and ZETIA 10mg /d... ~  FLP 6/08 showed TChol 184, TG 86, HDL 54, LDL 113 ~  FLP 7/09 showed TChol 169, TG 65, HDL 50, LDL 106 ~  FLP 7/10 showed TChol 174, TG 67, HDL 54, LDL 107 ~  FLP 2/11 showed TChol 180, TG 74, HDL 64, LDL 101 ~  FLP 8/11 showed TChol 168, TG 65, HDL 53, LDL 102 ~  FLP 2/12 showed TChol 182, TG 70, HDL56, LDL 112 ~  FLP 2/13 on Zetia10 showed TChol 186, TG 68, HDL 62, LDL 110  DIVERTICULOSIS OF COLON (ICD-562.10) - she notes occas constipation (uses prunes prn)... last colonoscopy 1/08 by DrPatterson was neg...  DEGENERATIVE JOINT DISEASE, GENERALIZED (ICD-715.00) - uses TYLENOL as needed, and has VOLTAREN 75mg  Prn as  well... c/o hip & knee pains- offered Ortho referral but she declines... never tried the knee braces... uses a cane.  OSTEOPENIA (ICD-733.90) - on calcium, multivits, & Vit D... prev on Actonel & this was held by [facility] for a drug holiday (stopped in ?2010),,, ~  BMD 3/10 showed TScores -1.5 in Spine & -1.8 left FemNeck... continue meds.  VITAMIN D DEFICIENCY (ICD-268.9) ~  labs 7/09 showed Vit D level= 15... rec- start Vit D 50,000 u weekly. ~  labs 1/10 showed Vit D level = 27... continue 50K. ~  labs 7/10 showed Vit D level = 48... OK to change to 1000 u daily. ~  labs 2/11 showed Vit D level = 35 ~  Labs 2/13 showed Vit D level = 24... rec to incr to 2000u daily...  Hx of BELLS PALSY (ICD-351.0) - hx of left Bell's palsy in 1980... resolved w/o residual wkness...  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 Mammogram 10/08 neg... had TETANUS shot & PNEUMOVAX- given 7/10.   Past Surgical History  Procedure Date  . Vesicovaginal fistula closure w/ tah 1974  . Cataract extraction     Outpatient Encounter Prescriptions as of 02/06/2012  Medication Sig Dispense Refill  . aspirin 81 MG tablet Take 81 mg by mouth daily.        . calcium-vitamin D (OSCAL WITH D) 500-200 MG-UNIT per tablet Take 1 tablet by mouth daily.        . Cholecalciferol (VITAMIN D) 1000 UNITS capsule Take 1,000 Units by mouth daily.        . diclofenac (VOLTAREN) 75 MG EC tablet Take 1 tablet by mouth twice a day with food for the arthritis pain  60 tablet  2  . ezetimibe (ZETIA) 10 MG tablet Take 1 tablet (10 mg total) by mouth daily.  30 tablet  6  . felodipine (PLENDIL) 5 MG 24 hr tablet Take 1 tablet (5 mg total) by mouth daily.  30 tablet  3  . furosemide (LASIX) 40 MG tablet Take 1 tablet (40 mg total) by mouth daily.  30 tablet  4  . lisinopril (PRINIVIL,ZESTRIL) 40 MG tablet Take 1 tablet (40  mg total) by mouth daily.  30 tablet  5  . Multiple Vitamins-Minerals (WOMENS MULTIVITAMIN PLUS) TABS Take 1 tablet by mouth daily.          No Known Allergies   Current Medications, Allergies, Past Medical History, Past Surgical History, Family History, and Social History were reviewed in [employer] record.    Review of Systems  See HPI - all other systems neg except as noted...  The patient complains of dyspnea on exertion, peripheral edema, and difficulty walking.  The patient denies anorexia, fever, weight loss, weight gain, vision loss, decreased hearing, hoarseness, chest pain, syncope, prolonged cough, headaches, hemoptysis, abdominal pain, melena, hematochezia, severe indigestion/heartburn, hematuria, incontinence, muscle weakness, suspicious skin lesions, transient blindness, depression, unusual weight change, abnormal bleeding, enlarged lymph nodes, and angioedema.     Objective:   Physical Exam    WD, WN, 78 y/o BF in NAD... she is quite remarkable for 70! GENERAL:  Alert & oriented; pleasant & cooperative... HEENT:  [place]/AT, EOM-wnl, PERRLA, EACs- recurrent debris bilat, TMs- not well seen, NOSE-clear, THROAT-clear & wnl. NECK:  Supple w/ fairROM; no JVD; normal carotid impulses w/o bruits; no thyromegaly or nodules palpated; no lymphadenopathy. CHEST:  Clear to P & A; without wheezes/ rales/ or rhonchi heard... HEART:  Regular Rhythm; gr 1/6 SEM, no rubs or gallops detected... ABDOMEN:  Soft & nontender; normal bowel sounds; no organomegaly or masses palpated... EXT: without deformities, mod arthritic changes; no varicose veins/ +venous insuffic/ tr edema. NEURO:  CN's intact; no focal neuro deficits... DERM:  No lesions noted; no rash etc...  RADIOLOGY DATA:  Reviewed in the EPIC EMR & discussed w/ the patient...    >>CXR 2/13 showed normal heart size, clear lungs, DJD in spine w/ scolosis...  LABORATORY DATA:  Reviewed in the EPIC EMR &  discussed w/ the patient...    >>LABS 2/13 showed FLP- looks good on Zetia10;  Chems- wnl;  CBC- wnl;  TSH=1.97;  VitD=26 on 1000u/d & needs incr to 2000u/d.    Assessment & Plan:   HBP>  Controlled on Felodipine, Lisinopril, Lasix when she takes meds regularly; continue same + low sodium diet etc...  CHOL>  Reasonably well controlled on Zetia; doesn't want statin Rx; continue same + diet- low chol, low fat...  GI> Divertics> stable, doing well, last colonoscopy was 2008 & neg...  ORTHO> DJD>  Voltaren + OTC Tylenol works for her, she is offered ortho referral but he declines...  Osteopenia>  On Calcium, MVI, Vit D; prev on Actonel but GYN DrLaVoie held it for drug holiday starting in 2010...  Other medical issues as noted...   Patient's Medications  New Prescriptions   No medications on file  Previous Medications   ASPIRIN 81 MG TABLET    Take 81 mg by mouth daily.     CALCIUM-VITAMIN D (OSCAL WITH D) 500-200 MG-UNIT PER TABLET    Take 1 tablet by mouth daily.     CHOLECALCIFEROL (VITAMIN D) 1000 UNITS CAPSULE    Take 1,000 Units by mouth daily.     DICLOFENAC (VOLTAREN) 75 MG EC TABLET    Take 1 tablet by mouth twice a day with food for the arthritis pain   EZETIMIBE (ZETIA) 10 MG TABLET    Take 1 tablet (10 mg total) by mouth daily.   FELODIPINE (PLENDIL) 5 MG 24 HR TABLET    Take 1 tablet (5 mg total) by mouth daily.   FUROSEMIDE (LASIX) 40 MG TABLET    Take 1 tablet (40 mg total) by mouth daily.   LISINOPRIL (PRINIVIL,ZESTRIL) 40 MG TABLET    Take 1 tablet (40 mg total) by mouth daily.   MULTIPLE VITAMINS-MINERALS (WOMENS MULTIVITAMIN PLUS) TABS    Take 1 tablet by mouth daily.    Modified Medications   No medications on file  Discontinued Medications   No medications on file

## 2012-02-06 NOTE — Patient Instructions (Addendum)
Today we updated your med list in our EPIC system...    Continue your current medications the same...  You MUST eliminate all the salt/ sodium from your diet or we will have to increase your BP meds...    You may season w/ Salt substitute or MRS DASH...  Be sure to take all your meds regularly...  Monitor your BP at home...    Our goal is 140/80 or better...  Call for any questions...  Let's plan a follow up visit in 6 months, sooner if needed for problems.Marland KitchenMarland Kitchen

## 2012-04-11 ENCOUNTER — Other Ambulatory Visit: Payer: Self-pay | Admitting: Pulmonary Disease

## 2012-04-11 ENCOUNTER — Ambulatory Visit (INDEPENDENT_AMBULATORY_CARE_PROVIDER_SITE_OTHER): Payer: Self-pay

## 2012-04-11 DIAGNOSIS — Z23 Encounter for immunization: Secondary | ICD-10-CM

## 2012-04-11 MED ORDER — FELODIPINE ER 5 MG PO TB24: 5.0000 mg | ORAL_TABLET | Freq: Every day | ORAL | Status: DC

## 2012-04-12 DIAGNOSIS — Z23 Encounter for immunization: Secondary | ICD-10-CM

## 2012-06-08 ENCOUNTER — Telehealth: Payer: Self-pay | Admitting: Pulmonary Disease

## 2012-06-08 MED ORDER — DICLOFENAC SODIUM 75 MG PO TBEC: DELAYED_RELEASE_TABLET | ORAL | Status: DC

## 2012-06-08 NOTE — Telephone Encounter (Signed)
Pharmacy requesting diclofenac sodium 75mg <> take 1 tablet twice daily for pain . #60 x5 No Known Allergies Dr Kriste Basque  Please advise  Thank you

## 2012-06-08 NOTE — Telephone Encounter (Signed)
Refill of the voltaren has been sent to the pharmacy for the pt.

## 2013-01-01 ENCOUNTER — Other Ambulatory Visit: Payer: Self-pay | Admitting: Pulmonary Disease

## 2013-01-21 ENCOUNTER — Telehealth: Payer: Self-pay | Admitting: Pulmonary Disease

## 2013-01-22 NOTE — Telephone Encounter (Signed)
i have called and lmom for the pt to make her aware that forms have been completed and these have been placed up front for the pt to pick up.

## 2013-01-31 ENCOUNTER — Other Ambulatory Visit: Payer: Self-pay | Admitting: Pulmonary Disease

## 2013-02-10 ENCOUNTER — Other Ambulatory Visit: Payer: Self-pay | Admitting: Pulmonary Disease

## 2013-03-20 ENCOUNTER — Other Ambulatory Visit: Payer: Self-pay | Admitting: Pulmonary Disease

## 2016-03-24 ENCOUNTER — Encounter: Payer: Self-pay | Admitting: Podiatry

## 2016-03-24 ENCOUNTER — Ambulatory Visit (INDEPENDENT_AMBULATORY_CARE_PROVIDER_SITE_OTHER): Payer: Medicare (Managed Care) | Admitting: Podiatry

## 2016-03-24 VITALS — BP 139/80 | HR 70 | Ht 61.0 in | Wt 144.0 lb

## 2016-03-24 DIAGNOSIS — L84 Corns and callosities: Secondary | ICD-10-CM | POA: Diagnosis not present

## 2016-03-24 DIAGNOSIS — M7750 Other enthesopathy of unspecified foot: Secondary | ICD-10-CM

## 2016-03-24 DIAGNOSIS — I739 Peripheral vascular disease, unspecified: Secondary | ICD-10-CM

## 2016-03-24 DIAGNOSIS — M79673 Pain in unspecified foot: Secondary | ICD-10-CM | POA: Diagnosis not present

## 2016-03-24 DIAGNOSIS — M779 Enthesopathy, unspecified: Secondary | ICD-10-CM

## 2016-03-24 NOTE — Patient Instructions (Signed)
Seen for painful toes on both great toes. Noted of long great toe with pressure caused hard keratotic tissue build up. May benefit from having open toed shoes or make window at distal end of the shoe to avoid pressure to the toes. Bring shoes that need be adjusted for painful toes.

## 2016-03-24 NOTE — Progress Notes (Signed)
SUBJECTIVE: 82 y.o. year old female presents with her daughter for painful big toes. They hurt in shoes. Patient points callused big toe at the tip being the area of foot pain.  Patient is wearing tennis shoes and walk with walker.  REVIEW OF SYSTEMS: A comprehensive review of systems was negative except for: hypertension and toe pain.  OBJECTIVE: DERMATOLOGIC EXAMINATION: Nails: All nails are thin and well maintained.  Positive of hard disc shape callus at distal end of both great toes.   VASCULAR EXAMINATION OF LOWER LIMBS: Pedal pulses are not palpable. Capillary Filling times within 3 seconds in all digits.  No visible edema or erythema noted. Temperature gradient from tibial crest to dorsum of foot is within normal bilateral.  NEUROLOGIC EXAMINATION OF THE LOWER LIMBS: Achilles DTR is present and within normal. Monofilament (Semmes-Weinstein 10-gm) sensory testing positive 6 out of 6, bilateral. Vibratory sensations(128Hz  turning fork) intact at medial and lateral forefoot bilateral.  Sharp and Dull discriminatory sensations at the plantar ball of hallux is intact bilateral.   MUSCULOSKELETAL EXAMINATION: Positive for irregular bone formation over tarsal joint dorsum of both feet. Positive of hard palpable bone just beneath the callused skin at tip of both great toes.  Long and extended both great toes.  ASSESSMENT: Long great toe bilateral. Hypertrophic bone at distal end of both great toe at distal phalanx.  Friction induced callus distal end both great toe, symptomatic in closed in shoes, high risk of getting ulcer. PVD.  PLAN: Reviewed findings and available options.  Callused distal end debrided on both great toe.  Instructed to avoid closed in shoes. If must wear closed in shoes, the toe box shoe be open to avoid pressure to the great toe. Daughter will bring Angela Coleman back with shoes to make accommodation to the shoes.

## 2016-03-25 ENCOUNTER — Ambulatory Visit: Payer: Medicare (Managed Care) | Admitting: Podiatry

## 2016-06-16 ENCOUNTER — Inpatient Hospital Stay (HOSPITAL_COMMUNITY)
Admission: EM | Admit: 2016-06-16 | Discharge: 2016-06-18 | DRG: 379 | Disposition: A | Discharge status: Home / Self Care | Payer: Medicare (Managed Care) | Attending: Internal Medicine | Admitting: Internal Medicine

## 2016-06-16 ENCOUNTER — Encounter (HOSPITAL_COMMUNITY): Payer: Self-pay | Admitting: *Deleted

## 2016-06-16 DIAGNOSIS — I10 Essential (primary) hypertension: Secondary | ICD-10-CM | POA: Diagnosis present

## 2016-06-16 DIAGNOSIS — D649 Anemia, unspecified: Secondary | ICD-10-CM | POA: Diagnosis present

## 2016-06-16 DIAGNOSIS — K5730 Diverticulosis of large intestine without perforation or abscess without bleeding: Secondary | ICD-10-CM | POA: Diagnosis present

## 2016-06-16 DIAGNOSIS — K449 Diaphragmatic hernia without obstruction or gangrene: Secondary | ICD-10-CM

## 2016-06-16 DIAGNOSIS — M6281 Muscle weakness (generalized): Secondary | ICD-10-CM

## 2016-06-16 DIAGNOSIS — I872 Venous insufficiency (chronic) (peripheral): Secondary | ICD-10-CM | POA: Diagnosis present

## 2016-06-16 DIAGNOSIS — Z7982 Long term (current) use of aspirin: Secondary | ICD-10-CM

## 2016-06-16 DIAGNOSIS — K922 Gastrointestinal hemorrhage, unspecified: Secondary | ICD-10-CM

## 2016-06-16 DIAGNOSIS — M8580 Other specified disorders of bone density and structure, unspecified site: Secondary | ICD-10-CM | POA: Diagnosis present

## 2016-06-16 DIAGNOSIS — E785 Hyperlipidemia, unspecified: Secondary | ICD-10-CM | POA: Diagnosis present

## 2016-06-16 DIAGNOSIS — I129 Hypertensive chronic kidney disease with stage 1 through stage 4 chronic kidney disease, or unspecified chronic kidney disease: Secondary | ICD-10-CM | POA: Diagnosis present

## 2016-06-16 DIAGNOSIS — Z800 Family history of malignant neoplasm of digestive organs: Secondary | ICD-10-CM

## 2016-06-16 DIAGNOSIS — N183 Chronic kidney disease, stage 3 (moderate): Secondary | ICD-10-CM | POA: Diagnosis present

## 2016-06-16 DIAGNOSIS — K920 Hematemesis: Secondary | ICD-10-CM | POA: Diagnosis not present

## 2016-06-16 DIAGNOSIS — Z79899 Other long term (current) drug therapy: Secondary | ICD-10-CM

## 2016-06-16 DIAGNOSIS — E7800 Pure hypercholesterolemia, unspecified: Secondary | ICD-10-CM | POA: Diagnosis present

## 2016-06-16 DIAGNOSIS — E876 Hypokalemia: Secondary | ICD-10-CM | POA: Diagnosis present

## 2016-06-16 LAB — CBC WITH DIFFERENTIAL/PLATELET
BASOS PCT: 0 %
Basophils Absolute: 0 10*3/uL (ref 0.0–0.1)
Eosinophils Absolute: 0 10*3/uL (ref 0.0–0.7)
Eosinophils Relative: 0 %
HEMATOCRIT: 29.6 % — AB (ref 36.0–46.0)
HEMOGLOBIN: 9.6 g/dL — AB (ref 12.0–15.0)
LYMPHS ABS: 0.6 10*3/uL — AB (ref 0.7–4.0)
Lymphocytes Relative: 9 %
MCH: 25.8 pg — ABNORMAL LOW (ref 26.0–34.0)
MCHC: 32.4 g/dL (ref 30.0–36.0)
MCV: 79.6 fL (ref 78.0–100.0)
MONOS PCT: 7 %
Monocytes Absolute: 0.5 10*3/uL (ref 0.1–1.0)
NEUTROS ABS: 5.8 10*3/uL (ref 1.7–7.7)
NEUTROS PCT: 84 %
Platelets: 207 10*3/uL (ref 150–400)
RBC: 3.72 MIL/uL — AB (ref 3.87–5.11)
RDW: 14.5 % (ref 11.5–15.5)
WBC: 6.9 10*3/uL (ref 4.0–10.5)

## 2016-06-16 LAB — COMPREHENSIVE METABOLIC PANEL
ALT: 9 U/L — ABNORMAL LOW (ref 14–54)
ANION GAP: 9 (ref 5–15)
AST: 18 U/L (ref 15–41)
Albumin: 3.3 g/dL — ABNORMAL LOW (ref 3.5–5.0)
Alkaline Phosphatase: 59 U/L (ref 38–126)
BUN: 32 mg/dL — ABNORMAL HIGH (ref 6–20)
CALCIUM: 8.5 mg/dL — AB (ref 8.9–10.3)
CHLORIDE: 103 mmol/L (ref 101–111)
CO2: 31 mmol/L (ref 22–32)
Creatinine, Ser: 0.87 mg/dL (ref 0.44–1.00)
GFR, EST NON AFRICAN AMERICAN: 56 mL/min — AB (ref >60)
Glucose, Bld: 128 mg/dL — ABNORMAL HIGH (ref 65–99)
Potassium: 3.5 mmol/L (ref 3.5–5.1)
SODIUM: 143 mmol/L (ref 135–145)
Total Bilirubin: 0.4 mg/dL (ref 0.3–1.2)
Total Protein: 6.6 g/dL (ref 6.5–8.1)

## 2016-06-16 LAB — PROTIME-INR
INR: 1.13
PROTHROMBIN TIME: 14.5 s (ref 11.4–15.2)

## 2016-06-16 LAB — TYPE AND SCREEN
ABO/RH(D): O POS
ANTIBODY SCREEN: NEGATIVE

## 2016-06-16 LAB — POC OCCULT BLOOD, ED: FECAL OCCULT BLD: NEGATIVE

## 2016-06-16 MED ORDER — ONDANSETRON HCL 4 MG/2ML IJ SOLN: 4.0000 mg | Freq: Four times a day (QID) | INTRAMUSCULAR | Status: DC | PRN

## 2016-06-16 MED ORDER — PANTOPRAZOLE SODIUM 40 MG PO TBEC
40.0000 mg | DELAYED_RELEASE_TABLET | Freq: Two times a day (BID) | ORAL | Status: DC
  Administered 2016-06-17 – 2016-06-18 (×4): 40 mg via ORAL
  Filled 2016-06-16 (×4): qty 1

## 2016-06-16 MED ORDER — ACETAMINOPHEN 325 MG PO TABS: 650.0000 mg | ORAL_TABLET | Freq: Four times a day (QID) | ORAL | Status: DC | PRN

## 2016-06-16 MED ORDER — ACETAMINOPHEN 650 MG RE SUPP: 650.0000 mg | Freq: Four times a day (QID) | RECTAL | Status: DC | PRN

## 2016-06-16 MED ORDER — PANTOPRAZOLE SODIUM 40 MG IV SOLR
40.0000 mg | Freq: Once | INTRAVENOUS | Status: AC
  Administered 2016-06-16: 40 mg via INTRAVENOUS
  Filled 2016-06-16: qty 40

## 2016-06-16 MED ORDER — ONDANSETRON HCL 4 MG PO TABS: 4.0000 mg | ORAL_TABLET | Freq: Four times a day (QID) | ORAL | Status: DC | PRN

## 2016-06-16 MED ORDER — DOCUSATE SODIUM 100 MG PO CAPS
100.0000 mg | ORAL_CAPSULE | Freq: Two times a day (BID) | ORAL | Status: DC
  Administered 2016-06-17 – 2016-06-18 (×4): 100 mg via ORAL
  Filled 2016-06-16 (×4): qty 1

## 2016-06-16 NOTE — ED Notes (Signed)
Bed: [id number] Expected date:  Expected time:  Means of arrival:  Comments: 82 yo hematemesis

## 2016-06-16 NOTE — H&P (Signed)
Date: 06/16/2016               Patient Name:  Angela Coleman MRN: [id number]  DOB: March 24, 1922 Age / Sex: [age] y.o., female   PCP: Jethro Bastosobert N Koehler, MD         Medical Service: Internal Medicine Teaching Service         Attending Physician: Dr. Gust RungErik C Hoffman, DO    First Contact: Dr. Marinda ElkStrelow Pager: [phone]  Second Contact: Dr. Loney Lohathore  Pager: [phone]       After Hours (After 5p/  First Contact Pager: [phone]  weekends / holidays): Second Contact Pager: [phone]   Chief Complaint: hematemesis   History of Present Illness: Angela Coleman is a 82 y.o. female with a PMH of HTN, HLD, and osteopenia who presents with hematemesis. [facility] and had just finished exercising when she began feeling tired and weak suddenly. She then had 3-4 episodes of dark brown and bloody vomiting. She has felt fine since the vomiting earlier. Denies abdominal pain, chest pain or tightness, palpitations, diaphoresis, melena, or hematochezia.   No known history of GERD or PUD and denies aspirin or NSAID use. Per colonoscopy in 2008 she had no polyps or diverticulosis, it is unclear why diverticulosis was documented in her problem list in 2009.   In the ED she was afebrile with HR 70s, BP 110/70s, SpO2 100% on room air. Labs revealed BUN 32, Hgb 9.6. Was given IV protonix 40 mg and admitted for GI bleed.  Meds:  No outpatient prescriptions have been marked as taking for the 06/16/16 encounter [facility](Hospital Encounter).   Allergies: Allergies as of 06/16/2016  . (No Known Allergies)   Past Medical History:  Diagnosis Date  . Bell's palsy   . Disorder of bone and cartilage, unspecified   . Diverticulosis of colon (without mention of hemorrhage)   . Generalized osteoarthrosis, unspecified site   . Paralytic strabismus, sixth or abducens nerve palsy   . Pure hypercholesterolemia   . Sebaceous cyst   . Unspecified essential hypertension   . Unspecified hearing loss   . Unspecified  venous (peripheral) insufficiency   . Unspecified vitamin D deficiency    Family History:  Family History  Problem Relation Age of Onset  . Diabetes Father   Mother - colon CA diagnosed in 6860s   Social History:  Denies history of tobacco, alcohol, or illicit drug use.   Review of Systems: A complete ROS was negative except as per HPI.   Physical Exam: Blood pressure (!) 128/52, pulse 67, temperature 98.4 F (36.9 C), temperature source Oral, resp. rate 17, height 5\' 4"  (1.626 m), weight 142 lb 11.2 oz (64.7 kg), SpO2 100 %. Physical Exam  Constitutional: She is oriented to person, place, and time. She appears well-developed and well-nourished. No distress.  Elder woman, appears younger than stated age  HENT:  Head: Normocephalic and atraumatic.  Eyes: Conjunctivae are normal. No scleral icterus.  Cardiovascular: Normal rate, regular rhythm and intact distal pulses.   Murmur heard. 3/6 systolic murmur best heard over the RUS border  1+ left lower extremity pitting edema   Pulmonary/Chest: Effort normal and breath sounds normal. No respiratory distress.  Abdominal: Soft. Bowel sounds are normal. She exhibits no distension. There is no tenderness. There is no guarding.  Neurological: She is alert and oriented to person, place, and time.  Skin: Skin is warm and dry. She is not diaphoretic.  Psychiatric: She has  a normal mood and affect. Her behavior is normal.    EKG: Sinus rhythm, right axis deviation, T wave inversion lead III   Assessment & Plan by Problem: Active Problems:   HYPERCHOLESTEROLEMIA   Essential hypertension   Diverticulosis of colon  Upper GI bleed  History of coffee ground and bright red blood emesis are most concerning for upper GI bleed. She has no history of PUD and although ASA 81 mg daily is on her medication list, her daughter denies that she takes this. Hgb 9.6 on arrival and she has had no further episodes of vomiting since presentation. She denies  symptoms of anemia. Low MCH may be an indication of underlying iron deficiency anemia. She denies symptoms of MI.  Will consult GI tomorrow, we appreciate their recommendations.  Trending troponin, repeat EKG  Follow up ferritin, CBC  Ordered protonix 40 mg BID   Hypertension  Currently normotensive, will continue to monitor. Takes hydralazine 25 mg TID and lasix 40 mg qd will restart this home medication if she becomes hypertensive.   Osteopenia  Reports no recent fractures. Takes vitamin D and calcium carbonate at home.  Ordered Vitamin D level   Code Status FULL   Dispo: Admit patient to Observation with expected length of stay less than 2 midnights.  Signed: Eulah PontNina Nabila Albarracin, MD 06/16/2016, 11:35 PM  Pager: [phone]

## 2016-06-16 NOTE — Progress Notes (Signed)
New Admission Note:   Arrival Method: Via [facility] from [facility] ED Mental Orientation: Alert and oriented x4 Telemetry: Box #03 Assessment: Completed Skin: Intact IV: Rt AC-NSL Pain: Denies Tubes: N/A Safety Measures: Safety Fall Prevention Plan has been given, discussed  Admission: Completed [address] Orientation: Patient has been orientated to the room, unit and staff.  Family: Daughter at bedside  Orders have been reviewed and implemented. Will continue to monitor the patient. Call light has been placed within reach and bed alarm has been activated.   Toll BrothersCharito Jenavee Laguardia BSN, RN-BC Phone number: [phone]

## 2016-06-16 NOTE — ED Notes (Signed)
Consent signed for blood transfusion

## 2016-06-16 NOTE — ED Triage Notes (Signed)
Per EMS, pt felt lethargic, nauseas, and vomited ~1 cup dark red blood while at a [employer] for [employer]. Pt states she felt better after vomiting, pt is A&Ox4. Pt lives at home with her daughter.

## 2016-06-16 NOTE — ED Notes (Signed)
ED Provider at bedside. EDP [place] 

## 2016-06-16 NOTE — ED Notes (Signed)
[facility] called. 

## 2016-06-16 NOTE — ED Notes (Signed)
ED Provider at bedside. 

## 2016-06-16 NOTE — ED Provider Notes (Signed)
[facility]-EMERGENCY DEPT Provider Note   CSN: [id number] Arrival date & time: 06/16/16  1457     History   Chief Complaint Chief Complaint  Patient presents with  . Hematemesis    HPI Angela Coleman is a 82 y.o. female.  HPI  82 year old female presents with hematemesis. Patient had just finished exercising and was waiting on her ride home when she started to feel fatigued. She states this lasted until she started vomiting and vomited about 3 or 4 times. She states that the vomit looked like "dark chocolate". She describes as black. She was told there was blood in it from staff members. Patient states that after she was done vomiting she felt back to normal and currently feels completely 8 and hematocrit. She has never had chest pain during this time. No abdominal pain. No known history of peptic ulcer disease. She denies any aspirin or NSAID use. Denies any melena or hematochezia. She states she has not had any change in her bowel habits recently.  Past Medical History:  Diagnosis Date  . Bell's palsy   . Disorder of bone and cartilage, unspecified   . Diverticulosis of colon (without mention of hemorrhage)   . Generalized osteoarthrosis, unspecified site   . Paralytic strabismus, sixth or abducens nerve palsy   . Pure hypercholesterolemia   . Sebaceous cyst   . Unspecified essential hypertension   . Unspecified hearing loss   . Unspecified venous (peripheral) insufficiency   . Unspecified vitamin D deficiency     Patient Active Problem List   Diagnosis Date Noted  . VITILIGO 09/13/2009  . VITAMIN D DEFICIENCY 01/18/2009  . BELLS PALSY 01/18/2008  . PARALYTIC STRABISMUS SIXTH/ABDUCENS NERVE PALSY 01/18/2008  . HEARING LOSS 01/18/2008  . Diverticulosis of colon 01/18/2008  . OSTEOPENIA 01/18/2008  . HYPERCHOLESTEROLEMIA 09/03/2007  . SEBACEOUS CYST 09/03/2007  . VENOUS INSUFFICIENCY 05/28/2007  . Essential hypertension 04/17/2007  . DEGENERATIVE JOINT DISEASE, GENERALIZED  04/17/2007    Past Surgical History:  Procedure Laterality Date  . CATARACT EXTRACTION    . VESICOVAGINAL FISTULA CLOSURE W/ [facility]  1974    OB History    No data available       Home Medications    Prior to Admission medications   Medication Sig Start Date End Date Taking? Authorizing Provider  AMLODIPINE BESYLATE-VALSARTAN PO Take 10 mg by mouth.    Historical Provider, MD  aspirin 81 MG tablet Take 81 mg by mouth daily.      Historical Provider, MD  calcium-vitamin D (OSCAL WITH D) 500-200 MG-UNIT per tablet Take 1 tablet by mouth daily.      Historical Provider, MD  Cholecalciferol (VITAMIN D) 1000 UNITS capsule Take 1,000 Units by mouth daily.      Historical Provider, MD  diclofenac (VOLTAREN) 75 MG EC tablet Take 1 tablet by mouth twice a day with food for the arthritis pain Patient not taking: Reported on 03/24/2016 06/08/12   [name], MD  ezetimibe (ZETIA) 10 MG tablet Take 1 tablet (10 mg total) by mouth daily. Patient not taking: Reported on 03/24/2016 12/28/11   [name], MD  felodipine (PLENDIL) 5 MG 24 hr tablet Take 1 tablet (5 mg total) by mouth daily. 04/11/12 04/11/13  [name], MD  furosemide (LASIX) 40 MG tablet TAKE 1 TABLET BY MOUTH EVERY DAY 01/31/13   [name], MD  lisinopril (PRINIVIL,ZESTRIL) 40 MG tablet TAKE ONE TABLET BY MOUTH ONE TIME DAILY- RX EXPIRED Patient not taking:  Reported on 03/24/2016 02/10/13   Michele McalpineScott M Nadel, MD  Multiple Vitamins-Minerals (WOMENS MULTIVITAMIN PLUS) TABS Take 1 tablet by mouth daily.      Historical Provider, MD    Family History Family History  Problem Relation Age of Onset  . Diabetes Father     Social History Social History  Substance Use Topics  . Smoking status: Never Smoker  . Smokeless tobacco: Never Used  . Alcohol use No     Allergies   Patient has no known allergies.   Review of Systems Review of Systems  Constitutional: Positive for fatigue.  Cardiovascular: Negative for chest pain.    Gastrointestinal: Positive for nausea and vomiting. Negative for abdominal pain, blood in stool, constipation and diarrhea.  All other systems reviewed and are negative.    Physical Exam Updated Vital Signs BP (!) 128/52 (BP Location: Right Arm)   Pulse 67   Temp 98.4 F (36.9 C) (Oral)   Resp 17   Ht 5\' 4"  (1.626 m)   Wt 142 lb 11.2 oz (64.7 kg)   SpO2 100%   BMI 24.49 kg/m   Physical Exam  Constitutional: She is oriented to person, place, and time. She appears well-developed and well-nourished. No distress.  HENT:  Head: Normocephalic and atraumatic.  Right Ear: External ear normal.  Left Ear: External ear normal.  Nose: Nose normal.  Eyes: Right eye exhibits no discharge. Left eye exhibits no discharge.  Cardiovascular: Normal rate, regular rhythm and normal heart sounds.   Pulmonary/Chest: Effort normal and breath sounds normal.  Abdominal: Soft. She exhibits no distension. There is no tenderness.  Genitourinary:  Genitourinary Comments: No obvious external hemorrhoids. Light brown stool on exam  Neurological: She is alert and oriented to person, place, and time.  Skin: Skin is warm and dry. She is not diaphoretic.  Nursing note and vitals reviewed.    ED Treatments / Results  Labs (all labs ordered are listed, but only abnormal results are displayed) Labs Reviewed  COMPREHENSIVE METABOLIC PANEL - Abnormal; Notable for the following:       Result Value   Glucose, Bld 128 (*)    BUN 32 (*)    Calcium 8.5 (*)    Albumin 3.3 (*)    ALT 9 (*)    GFR calc non Af Amer 56 (*)    All other components within normal limits  CBC WITH DIFFERENTIAL/PLATELET - Abnormal; Notable for the following:    RBC 3.72 (*)    Hemoglobin 9.6 (*)    HCT 29.6 (*)    MCH 25.8 (*)    Lymphs Abs 0.6 (*)    All other components within normal limits  PROTIME-INR  COMPREHENSIVE METABOLIC PANEL  CBC  PROTIME-INR  APTT  TROPONIN I  TROPONIN I  TROPONIN I  VITAMIN D 25 HYDROXY (VIT  D DEFICIENCY, FRACTURES)  FERRITIN  POC OCCULT BLOOD, ED  TYPE AND SCREEN  ABO/RH    EKG  EKG Interpretation  Date/Time:  Thursday June 16 2016 15:36:31 EST Ventricular Rate:  87 PR Interval:    QRS Duration: 83 QT Interval:  364 QTC Calculation: 438 R Axis:   90 Text Interpretation:  Right and left arm electrode reversal, interpretation assumes no reversal Sinus rhythm Prolonged PR interval Borderline right axis deviation Borderline T abnormalities, inferior leads No old tracing to compare Confirmed by Yuko Coventry MD, Dakai Braithwaite [phone]([id number]) on 06/16/2016 3:40:23 PM       Radiology No results found.  Procedures Procedures (including critical  care time)  Medications Ordered in ED Medications  acetaminophen (TYLENOL) tablet 650 mg (not administered)    Or  acetaminophen (TYLENOL) suppository 650 mg (not administered)  docusate sodium (COLACE) capsule 100 mg (100 mg Oral Given 06/17/16 0014)  ondansetron (ZOFRAN) tablet 4 mg (not administered)    Or  ondansetron (ZOFRAN) injection 4 mg (not administered)  pantoprazole (PROTONIX) EC tablet 40 mg (40 mg Oral Given 06/17/16 0014)  pantoprazole (PROTONIX) injection 40 mg (40 mg Intravenous Given 06/16/16 1721)     Initial Impression / Assessment and Plan / ED Course  I have reviewed the triage vital signs and the nursing notes.  Pertinent labs & imaging results that were available during my care of the patient were reviewed by me and considered in my medical decision making (see chart for details).  Clinical Course as of Jun 17 45  Thu Jun 16, 2016  1528 Patient currently has not complaints. VSS. Will get labs, ECG, place IV and check FOBT.  [SG]  1818 IM teaching service to [facility]  [SG]    Clinical Course User Index [SG] Pricilla LovelessScott Maripaz Mullan, MD    [facility]. No further vomiting. Unclear of acuity of anemia as last labs are from 3+ years  ago. VSS. Given IV protonix. Will have patient observed and have serial hemoglobins.   Final Clinical Impressions(s) / ED Diagnoses   Final diagnoses:  Hematemesis, presence of nausea not specified    New Prescriptions Current Discharge Medication List       Pricilla LovelessScott Keagon Glascoe, MD 06/17/16 [phone]

## 2016-06-16 NOTE — ED Notes (Signed)
ED Provider at bedside.performing occult blood

## 2016-06-17 ENCOUNTER — Encounter (HOSPITAL_COMMUNITY)
Admission: EM | Disposition: A | Discharge status: Home / Self Care | Payer: Self-pay | Source: Home / Self Care | Attending: Internal Medicine

## 2016-06-17 ENCOUNTER — Observation Stay (HOSPITAL_COMMUNITY): Payer: Medicare (Managed Care) | Admitting: Certified Registered"

## 2016-06-17 ENCOUNTER — Encounter (HOSPITAL_COMMUNITY): Payer: Self-pay | Admitting: Certified Registered"

## 2016-06-17 ENCOUNTER — Telehealth: Payer: Self-pay

## 2016-06-17 DIAGNOSIS — D649 Anemia, unspecified: Secondary | ICD-10-CM | POA: Diagnosis present

## 2016-06-17 DIAGNOSIS — Z800 Family history of malignant neoplasm of digestive organs: Secondary | ICD-10-CM | POA: Diagnosis not present

## 2016-06-17 DIAGNOSIS — Z79899 Other long term (current) drug therapy: Secondary | ICD-10-CM | POA: Diagnosis not present

## 2016-06-17 DIAGNOSIS — E785 Hyperlipidemia, unspecified: Secondary | ICD-10-CM | POA: Diagnosis present

## 2016-06-17 DIAGNOSIS — K5730 Diverticulosis of large intestine without perforation or abscess without bleeding: Secondary | ICD-10-CM | POA: Diagnosis present

## 2016-06-17 DIAGNOSIS — I129 Hypertensive chronic kidney disease with stage 1 through stage 4 chronic kidney disease, or unspecified chronic kidney disease: Secondary | ICD-10-CM | POA: Diagnosis present

## 2016-06-17 DIAGNOSIS — I10 Essential (primary) hypertension: Secondary | ICD-10-CM | POA: Diagnosis not present

## 2016-06-17 DIAGNOSIS — M8580 Other specified disorders of bone density and structure, unspecified site: Secondary | ICD-10-CM | POA: Diagnosis present

## 2016-06-17 DIAGNOSIS — M810 Age-related osteoporosis without current pathological fracture: Secondary | ICD-10-CM

## 2016-06-17 DIAGNOSIS — E876 Hypokalemia: Secondary | ICD-10-CM | POA: Diagnosis present

## 2016-06-17 DIAGNOSIS — K922 Gastrointestinal hemorrhage, unspecified: Secondary | ICD-10-CM

## 2016-06-17 DIAGNOSIS — N183 Chronic kidney disease, stage 3 (moderate): Secondary | ICD-10-CM | POA: Diagnosis present

## 2016-06-17 DIAGNOSIS — Z833 Family history of diabetes mellitus: Secondary | ICD-10-CM

## 2016-06-17 DIAGNOSIS — D62 Acute posthemorrhagic anemia: Secondary | ICD-10-CM

## 2016-06-17 DIAGNOSIS — Z7982 Long term (current) use of aspirin: Secondary | ICD-10-CM | POA: Diagnosis not present

## 2016-06-17 DIAGNOSIS — E7800 Pure hypercholesterolemia, unspecified: Secondary | ICD-10-CM | POA: Diagnosis present

## 2016-06-17 DIAGNOSIS — Z8719 Personal history of other diseases of the digestive system: Secondary | ICD-10-CM

## 2016-06-17 DIAGNOSIS — K449 Diaphragmatic hernia without obstruction or gangrene: Secondary | ICD-10-CM

## 2016-06-17 DIAGNOSIS — K920 Hematemesis: Principal | ICD-10-CM

## 2016-06-17 DIAGNOSIS — I872 Venous insufficiency (chronic) (peripheral): Secondary | ICD-10-CM | POA: Diagnosis present

## 2016-06-17 HISTORY — PX: ESOPHAGOGASTRODUODENOSCOPY: SHX5428

## 2016-06-17 LAB — COMPREHENSIVE METABOLIC PANEL
ALBUMIN: 2.8 g/dL — AB (ref 3.5–5.0)
ALK PHOS: 46 U/L (ref 38–126)
ALT: 7 U/L — ABNORMAL LOW (ref 14–54)
ANION GAP: 7 (ref 5–15)
AST: 16 U/L (ref 15–41)
BILIRUBIN TOTAL: 0.6 mg/dL (ref 0.3–1.2)
BUN: 23 mg/dL — AB (ref 6–20)
CALCIUM: 8.2 mg/dL — AB (ref 8.9–10.3)
CO2: 30 mmol/L (ref 22–32)
Chloride: 104 mmol/L (ref 101–111)
Creatinine, Ser: 1 mg/dL (ref 0.44–1.00)
GFR calc Af Amer: 55 mL/min — ABNORMAL LOW (ref >60)
GFR, EST NON AFRICAN AMERICAN: 47 mL/min — AB (ref >60)
GLUCOSE: 88 mg/dL (ref 65–99)
POTASSIUM: 2.8 mmol/L — AB (ref 3.5–5.1)
Sodium: 141 mmol/L (ref 135–145)
TOTAL PROTEIN: 5.5 g/dL — AB (ref 6.5–8.1)

## 2016-06-17 LAB — MAGNESIUM: Magnesium: 2 mg/dL (ref 1.7–2.4)

## 2016-06-17 LAB — CBC
HEMATOCRIT: 24.3 % — AB (ref 36.0–46.0)
HEMATOCRIT: 26 % — AB (ref 36.0–46.0)
HEMATOCRIT: 23.3 % — AB (ref 36.0–46.0)
HEMOGLOBIN: 7.9 g/dL — AB (ref 12.0–15.0)
HEMOGLOBIN: 7.6 g/dL — AB (ref 12.0–15.0)
Hemoglobin: 8.4 g/dL — ABNORMAL LOW (ref 12.0–15.0)
MCH: 26.1 pg (ref 26.0–34.0)
MCH: 26.1 pg (ref 26.0–34.0)
MCH: 26.4 pg (ref 26.0–34.0)
MCHC: 32.5 g/dL (ref 30.0–36.0)
MCHC: 32.3 g/dL (ref 30.0–36.0)
MCHC: 32.6 g/dL (ref 30.0–36.0)
MCV: 80.2 fL (ref 78.0–100.0)
MCV: 80.7 fL (ref 78.0–100.0)
MCV: 80.9 fL (ref 78.0–100.0)
Platelets: 165 10*3/uL (ref 150–400)
Platelets: 188 10*3/uL (ref 150–400)
Platelets: 167 10*3/uL (ref 150–400)
RBC: 3.03 MIL/uL — ABNORMAL LOW (ref 3.87–5.11)
RBC: 3.22 MIL/uL — AB (ref 3.87–5.11)
RBC: 2.88 MIL/uL — ABNORMAL LOW (ref 3.87–5.11)
RDW: 14.6 % (ref 11.5–15.5)
RDW: 14.5 % (ref 11.5–15.5)
RDW: 14.7 % (ref 11.5–15.5)
WBC: 3.7 10*3/uL — AB (ref 4.0–10.5)
WBC: 3.8 10*3/uL — AB (ref 4.0–10.5)
WBC: 4.2 10*3/uL (ref 4.0–10.5)

## 2016-06-17 LAB — ABO/RH
ABO/RH(D): O POS
ABO/RH(D): O POS

## 2016-06-17 LAB — TYPE AND SCREEN
ABO/RH(D): O POS
Antibody Screen: NEGATIVE

## 2016-06-17 LAB — TROPONIN I
Troponin I: <0.03 ng/mL (ref <0.03)
Troponin I: <0.03 ng/mL (ref <0.03)
Troponin I: <0.03 ng/mL (ref <0.03)

## 2016-06-17 LAB — PROTIME-INR
INR: 1.25
PROTHROMBIN TIME: 15.8 s — AB (ref 11.4–15.2)

## 2016-06-17 LAB — FERRITIN: Ferritin: 17 ng/mL (ref 11–307)

## 2016-06-17 LAB — POTASSIUM: Potassium: 3.1 mmol/L — ABNORMAL LOW (ref 3.5–5.1)

## 2016-06-17 LAB — APTT: APTT: 33 s (ref 24–36)

## 2016-06-17 SURGERY — EGD (ESOPHAGOGASTRODUODENOSCOPY)
Anesthesia: Monitor Anesthesia Care

## 2016-06-17 MED ORDER — POTASSIUM CHLORIDE CRYS ER 20 MEQ PO TBCR
40.0000 meq | EXTENDED_RELEASE_TABLET | ORAL | Status: AC
  Administered 2016-06-17 – 2016-06-18 (×2): 40 meq via ORAL
  Filled 2016-06-17 (×2): qty 2

## 2016-06-17 MED ORDER — SODIUM CHLORIDE 0.9 % IV SOLN
INTRAVENOUS | Status: DC
  Administered 2016-06-17: 13:00:00 via INTRAVENOUS

## 2016-06-17 MED ORDER — KCL IN DEXTROSE-NACL 40-5-0.45 MEQ/L-%-% IV SOLN
INTRAVENOUS | Status: AC
  Administered 2016-06-17: 13:00:00 via INTRAVENOUS
  Filled 2016-06-17 (×2): qty 1000

## 2016-06-17 MED ORDER — LIDOCAINE 2% (20 MG/ML) 5 ML SYRINGE
INTRAMUSCULAR | Status: DC | PRN
  Administered 2016-06-17: 40 mg via INTRAVENOUS

## 2016-06-17 MED ORDER — POTASSIUM CHLORIDE 20 MEQ/15ML (10%) PO SOLN
40.0000 meq | Freq: Once | ORAL | Status: AC
  Administered 2016-06-17: 40 meq via ORAL
  Filled 2016-06-17: qty 30

## 2016-06-17 MED ORDER — PROPOFOL 500 MG/50ML IV EMUL
INTRAVENOUS | Status: DC | PRN
  Administered 2016-06-17: 150 ug/kg/min via INTRAVENOUS

## 2016-06-17 NOTE — Anesthesia Postprocedure Evaluation (Signed)
Anesthesia Post Note  Patient: Angela GaudierFrances Bonito  Procedure(s) Performed: Procedure(s) (LRB): ESOPHAGOGASTRODUODENOSCOPY (EGD) (N/A)  Patient location during evaluation: Endoscopy Anesthesia Type: MAC Level of consciousness: awake and alert Pain management: pain level controlled Vital Signs Assessment: post-procedure vital signs reviewed and stable Respiratory status: spontaneous breathing Cardiovascular status: stable Anesthetic complications: no       Last Vitals:  Vitals:   06/17/16 1420 06/17/16 1425  BP: (!) 114/57 (!) 114/57  Pulse: 65 67  Resp: 16 17  Temp:      Last Pain:  Vitals:   06/17/16 1410  TempSrc: Oral                 Lewie LoronJohn Kyandre Okray

## 2016-06-17 NOTE — Transfer of Care (Signed)
Immediate Anesthesia Transfer of Care Note  Patient: Angela Coleman  Procedure(s) Performed: Procedure(s): ESOPHAGOGASTRODUODENOSCOPY (EGD) (N/A)  Patient Location: Endoscopy Unit  Anesthesia Type:MAC  Level of Consciousness: sedated  Airway & Oxygen Therapy: Patient Spontanous Breathing and Patient connected to nasal cannula oxygen  Post-op Assessment: Report given to RN, Post -op Vital signs reviewed and stable and Patient moving all extremities  Post vital signs: Reviewed and stable  Last Vitals:  Vitals:   06/17/16 1318 06/17/16 1403  BP: 131/61   Pulse: 69 70  Resp: 13 16  Temp: 37 C     Last Pain:  Vitals:   06/17/16 1318  TempSrc: Oral         Complications: No apparent anesthesia complications

## 2016-06-17 NOTE — Telephone Encounter (Signed)
-----   Message from Rachael Feeaniel P Jacobs, MD sent at 06/17/2016  2:00 PM EST ----- She is being d/c this weekend  Needs rov with me next available, cbc in 2-3 weeks.   Thanks

## 2016-06-17 NOTE — Telephone Encounter (Signed)
Pt scheduled to see Dr. Christella HartiganJacobs 08/12/16@11 :15am and to have labs around second week of January. Letter mailed to pt and order in epic.

## 2016-06-17 NOTE — Consult Note (Signed)
Gastroenterology Consult: 10:54 AM 06/17/2016  LOS: 0 days    Referring Provider: Dr  Mikey BussingHoffman  Primary Care Physician:  [name],[name], MD Primary Gastroenterologist:  Domenick Bookbinderr.[name]     Reason for Consultation:  Dark emesis.     HPI: Angela Coleman is a 82 y.o. female.  PMH HTN.  HLD.    2008 colonoscopy avg risk screening (however patient advises me that her mother developed colon cancer in her late 6660s versus early 5270s). Dr. Jarold MottoPatterson. Normal study. 2001 colonoscopy.  Dr. Melvia Heapsobert Kaplan.  Scattered diverticulosis. Patient does not recall previous upper endoscopy.  [facility].  She spends the day there. She didn't eat as much lunch as usual, her appetite was suppressed so she just ate sweep potatoes. Then she participated in chair exercise, which consists of hand weights, some light aerobics and had just finished exercising when she felt acutely tired and weak.  She proceeded to have 3 or 4 episodes in quick succession. Initially it was brown, at the end there was some streaks of blood visible.  Her nausea resolved and has not returned. She had a bowel movement earlier in the morning and it was formed and brown per usual. Hgb 9.6 >> 7.9 this AM.  MCV 79.  Hgb in 2012,  2013 ~ 12.5.  Ferritin 17 BUN 32 >> 23.   Creatinines normal.  Troponin I nml x 2.  LFTs normal.    Home meds include 81 mg aspirin. No PPI or H2 blocker.  She does not use NSAIDs.  Since admission she has received a dose of IV Protonix and is now on oral, twice daily Protonix. Patient moves her bowels about every other day. She does not see blood in her stools.  No melena. No abdominal pain. Appetite is always good. No trouble swallowing. No heartburn or indigestion. She denies unusual bleeding or bruising. Hasn't had  any nosebleeds. Has never received blood transfusion. As a younger woman she may have taken iron supplements.   Past Medical History:  Diagnosis Date  . Bell's palsy   . Disorder of bone and cartilage, unspecified   . Diverticulosis of colon (without mention of hemorrhage)   . Generalized osteoarthrosis, unspecified site   . Paralytic strabismus, sixth or abducens nerve palsy   . Pure hypercholesterolemia   . Sebaceous cyst   . Unspecified essential hypertension   . Unspecified hearing loss   . Unspecified venous (peripheral) insufficiency   . Unspecified vitamin D deficiency     Past Surgical History:  Procedure Laterality Date  . CATARACT EXTRACTION    . VESICOVAGINAL FISTULA CLOSURE W/ [facility]  1974    Prior to Admission medications   Medication Sig Start Date End Date Taking? Authorizing Provider  aspirin 81 MG tablet Take 81 mg by mouth daily.      Historical Provider, MD  calcium-vitamin D (OSCAL WITH D) 500-200 MG-UNIT per tablet Take 1 tablet by mouth daily.      Historical Provider, MD  Cholecalciferol (VITAMIN D) 1000 UNITS capsule Take 1,000  Units by mouth daily.      Historical Provider, MD  furosemide (LASIX) 40 MG tablet TAKE 1 TABLET BY MOUTH EVERY DAY Patient taking differently: TAKE 2 TABLET BY MOUTH EVERY DAY 01/31/13   Michele McalpineScott M Nadel, MD  Multiple Vitamins-Minerals (WOMENS MULTIVITAMIN PLUS) TABS Take 1 tablet by mouth daily.      Historical Provider, MD    Scheduled Meds: . docusate sodium  100 mg Oral BID  . pantoprazole  40 mg Oral BID   Infusions:  PRN Meds: acetaminophen **OR** acetaminophen, ondansetron **OR** ondansetron (ZOFRAN) IV   Allergies as of 06/16/2016  . (No Known Allergies)    Family History  Problem Relation Age of Onset  . Diabetes Father     Social History   Social History  . Marital status: Widowed    Spouse name: N/A  . Number of children: 5  . Years of education: N/A   Occupational History  . retired    Social History  Main Topics  . Smoking status: Never Smoker  . Smokeless tobacco: Never Used  . Alcohol use No  . Drug use: No  . Sexual activity: Not on file   Other Topics Concern  . Not on file   Social History Narrative  . No narrative on file    REVIEW OF SYSTEMS: Constitutional:  Generally she has pretty good activity level, she exercises at the pace program twice a week. ENT:  No nose bleeds Pulm:  No cough or dyspnea CV:  No palpitations, no LE edema.  No chest pain GU:  No hematuria, no frequency GI:  Per HPI Heme:  Per HPI   Transfusions:  Per HPI Neuro:  No headaches, no peripheral tingling or numbness.  No dizziness. Sometimes she is forgetful but she has not had any overt confusion.  Derm:  No itching, no rash or sores.  Endocrine:  No sweats or chills.  No polyuria or dysuria Immunization:  Patient is up-to-date on her flu shot. Travel:  None beyond local counties in last few months.    PHYSICAL EXAM: Vital signs in last 24 hours: Vitals:   06/17/16 0610 06/17/16 0900  BP: (!) 103/45 (!) 116/49  Pulse: (!) 54 (!) 54  Resp: 17 18  Temp: 98.3 F (36.8 C) 98.8 F (37.1 C)   Wt Readings from Last 3 Encounters:  06/16/16 64.7 kg (142 lb 11.2 oz)  03/24/16 65.3 kg (144 lb)  02/06/12 70.9 kg (156 lb 6.4 oz)    General: Pleasant, elderly WF who looks great for her 89 years. Head:  No facial asymmetry or edema. No signs of head trauma.  Eyes:  No scleral icterus, no conjunctival pallor. Ears:  Mild hearing loss  Nose:  No discharge or congestion Mouth:  Many teeth are gone. She wears partial plates but these are not currently in place. Neck:  No JVD, no masses. No thyromegaly. Lungs:  Clear bilaterally. No dyspnea or cough Heart:  RRR. 2/6 SEM.  S1, S2 present. Abdomen:  Soft. Nontender. Active bowel sounds. No HSM, no masses. No bruits or hernias..   Rectal:  Deferred.   Musc/Skeltl:  No joint swelling or erythema. Some kyphosis. Some arthritic changes in the  fingers Extremities:  Minor slightly pitting lower extremity edema.  Neurologic:  Patient is alert. She is a good historian. Fluid speech She is oriented 3. No tremor. Moves all 4 limbs, limb strength not tested. Skin:  No sores, rashes or telangiectasia. Nodes:  No cervical adenopathy.  Psych:  Calm, cooperative, affect normal.  Intake/Output from previous day: 12/21 0701 - 12/22 0700 In: 0  Out: 325 [Urine:325] Intake/Output this shift: Total I/O In: 0  Out: 300 [Urine:300]  LAB RESULTS:  Recent Labs  06/16/16 1605 06/17/16 0624  WBC 6.9 3.7*  HGB 9.6* 7.9*  HCT 29.6* 24.3*  PLT 207 165   BMET Lab Results  Component Value Date   NA 141 06/17/2016   NA 143 06/16/2016   NA 140 08/08/2011   K 2.8 (L) 06/17/2016   K 3.5 06/16/2016   K 4.4 08/08/2011   CL 104 06/17/2016   CL 103 06/16/2016   CL 104 08/08/2011   CO2 30 06/17/2016   CO2 31 06/16/2016   CO2 31 08/08/2011   GLUCOSE 88 06/17/2016   GLUCOSE 128 (H) 06/16/2016   GLUCOSE 100 (H) 08/08/2011   BUN 23 (H) 06/17/2016   BUN 32 (H) 06/16/2016   BUN 13 08/08/2011   CREATININE 1.00 06/17/2016   CREATININE 0.87 06/16/2016   CREATININE 0.9 08/08/2011   CALCIUM 8.2 (L) 06/17/2016   CALCIUM 8.5 (L) 06/16/2016   CALCIUM 9.1 08/08/2011   LFT  Recent Labs  06/16/16 1605 06/17/16 0624  PROT 6.6 5.5*  ALBUMIN 3.3* 2.8*  AST 18 16  ALT 9* 7*  ALKPHOS 59 46  BILITOT 0.4 0.6   PT/INR Lab Results  Component Value Date   INR 1.25 06/17/2016   INR 1.13 06/16/2016   Hepatitis Panel No results for input(s): HEPBSAG, HCVAB, HEPAIGM, HEPBIGM in the last 72 hours. C-Diff No components found for: CDIFF Lipase  No results found for: LIPASE  Drugs of Abuse  No results found for: LABOPIA, COCAINSCRNUR, LABBENZ, AMPHETMU, THCU, LABBARB   RADIOLOGY STUDIES: No results found.    IMPRESSION:   *  Dark emesis with small streaks of blood. Rule out ulcer disease. Rule out AVMs. Less likely this represents a  Mallory-Weiss tear though this is in the differential diagnosis. Patient generally does not have GI issues.  *  Normocytic anemia. Not sure how acute this is as the only reference hemoglobin/hematocrit we have is from 2013.  No transfusions to date.  *  Azotemia. Consistent with upper GI bleed.  *  Hypokalemia, just finishing dose of liquid potassium as we spoke.   PLAN:     *  Needs EGD. We'll try to arrange this for today.  *  Continue twice daily oral Protonix.  *  Serial CBC. Anemia labs in the morning   Jennye Moccasin  06/17/2016, 10:54 AM Pager: [phone]    ________________________________________________________________________  Cor[facility] MD note:  I personally examined the patient, reviewed the data and agree with the assessment and plan described above.  Planning for EGD today for UGI bleed.   Rob Bunting, MD [facility] Gastroenterology Pager [phone]

## 2016-06-17 NOTE — H&P (Signed)
Internal Medicine Attending Admission Note  I saw and evaluated the patient. I reviewed the resident's note and I agree with the resident's findings and plan as documented in the resident's note.  Assessment & Plan by Problem:   Upper GI bleed - Dr Tasia CatchingsAhmed spoke with [facility], baseline Hgb around 11.5.  ASA is for primary PPx. No NSAIDS.  Daughter also report she does not take dicolfenac or other NSAIDS (other than occasional tylenol) - GI consult, likely needs EGD -Agree with oral protonix 40mg  BID - Hold ASA    Essential hypertension - Hold home meds given normotension and GI bleed. Monitor    Diverticulosis of colon - Suspect upper GI bleed, will monitor.   Hypokalemia - K+ 2.8 this AM.  Given 40mEq oral.  Will also give 1L of D5 1/2NS +40kcl over the next 10 hours in addition.  Repeat BMP and magnesium in AM.  Chief Complaint(s):hematemesis  History - key components related to admission:  Briefly Angela Coleman is a 82 year old female with PMH of HTN, HLD, osteopenia, and diverticulosis (jnoted on colonoscopy in 2001) who presented to the ED for hematemesis.  She is an active [facility] of [facility] and yesterday while at [facility] she was waiting before her ride home when she suddenly felt increased lethargy, she subsequently had 304 episodes of dark brown and blood tinged vomiting.  She denies any associated chest pain, tightness, palpitations, SOB, or other brusing or bleeding.  She does take a daily 81 mg Aspirin.  She takes occasional tylenol for knee pain.  Otherwise she does not take NSAIDs.  She has no history of ulcers and has never had an EGD.  She denies any heartburn or other GI symptoms.  She overall is feeling well today and has no new complaints.  Lab results: Reviewed in Epic  Physical Exam - key components related to admission: General: pleasant elderly female resting comfortably in bed HEENT: EOMI, no scleral icterus Cardiac: RRR, 2/6 systolic murmur Pulm: clear to auscultation  bilaterally, moving normal volumes of air Abd: soft, nontender, nondistended, BS present Ext: warm and well perfused, no pedal edema Neuro: alert, moving all 4 extremities  Vitals:   06/16/16 2100 06/16/16 2217 06/17/16 0610 06/17/16 0900  BP: 116/64 (!) 128/52 (!) 103/45 (!) 116/49  Pulse: 69 67 (!) 54 (!) 54  Resp: 15 17 17 18   Temp:  98.4 F (36.9 C) 98.3 F (36.8 C) 98.8 F (37.1 C)  TempSrc:  Oral Oral Oral  SpO2: 98% 100% 100% 100%  Weight:  142 lb 11.2 oz (64.7 kg)    Height:  5\' 4"  (1.626 m)

## 2016-06-17 NOTE — Progress Notes (Signed)
   Subjective:   Feels well. No longer having any emesis. Had 3 episodes of emesis with dark brown material yesterday. No chest pain, GERD symptoms, abd pain, rectal bleeding, melena. No complaints.   Objective:  Vital signs in last 24 hours: Vitals:   06/16/16 2001 06/16/16 2100 06/16/16 2217 06/17/16 0610  BP: 113/68 116/64 (!) 128/52 (!) 103/45  Pulse:  69 67 (!) 54  Resp: 26 15 17 17   Temp:   98.4 F (36.9 C) 98.3 F (36.8 C)  TempSrc:   Oral Oral  SpO2:  98% 100% 100%  Weight:   142 lb 11.2 oz (64.7 kg)   Height:   5\' 4"  (1.626 m)    Vitals reviewed. General: resting in bed, appears younger than her age, NAD HEENT: PERRL, EOMI, no scleral icterus Cardiac: RRR, 3/6 systolic murmur best heard at RUS border Pulm: clear to auscultation bilaterally, no wheezes, rales, or rhonchi Abd: soft, nontender, nondistended, BS present Ext: warm and well perfused, no pedal edema Neuro: alert and oriented X3, cranial nerves II-XII grossly intact, strength and sensation to light touch equal in bilateral upper and lower extremities   Assessment/Plan:  Principal Problem:   GI bleed Active Problems:   HYPERCHOLESTEROLEMIA   Essential hypertension   Diverticulosis of colon  82 yo F here with possible UGI bleed.  Possible UGI bleed  Had dark brown emesis. No GERD symptoms. Was taking asa 81mg  daily. No hx of ulcers or UGI in the past. Baseline hgb around 11.5 per Dr. Dorothe PeaKoehler. Here 9.6 >> 7.9 - cont IV protonix BID - trend cbc, tx <7 - trend trop, if has demand ischemia, will transfuse at a higher goal <8 - NPO - GI consulted for possible EGD  HTN Outpatient BP meds: amlodipine 10mg  daily + imdur 30, lasix 80mg   Daily.  -  hold BP meds in the setting of possible GI bleed. BP is normal now.   Hypokalemia - repleting  CKD III - stable.    Dispo: Anticipated discharge in approximately 1-2 day(s).   Hyacinth Meekerasrif Taneika Choi, MD 06/17/2016, 9:14 AM Pager: [phone]

## 2016-06-17 NOTE — Interval H&P Note (Signed)
History and Physical Interval Note:  06/17/2016 1:12 PM  Angela GaudierFrances Dobis  has presented today for surgery, with the diagnosis of Dark, then bloody emesis. Normocytic anemia  The various methods of treatment have been discussed with the patient and family. After consideration of risks, benefits and other options for treatment, the patient has consented to  Procedure(s): ESOPHAGOGASTRODUODENOSCOPY (EGD) (N/A) as a surgical intervention .  The patient's history has been reviewed, patient examined, no change in status, stable for surgery.  I have reviewed the patient's chart and labs.  Questions were answered to the patient's satisfaction.     Rachael FeeJacobs, Daniel P

## 2016-06-17 NOTE — Op Note (Signed)
[facility] Patient Name: Angela GaudierFrances Coleman Procedure Date : 06/17/2016 MRN: [id number] Attending MD: Rachael Feeaniel P Dencil Cayson , MD Date of Birth: 04/22/1922 CSN: [id number] Age: 82 Admit Type: Inpatient Procedure:                Upper GI endoscopy Indications:              anemia, Coffee-ground emesis, FOBT + stool Providers:                Rachael Feeaniel P. Clarann Helvey, MD, Dwain SarnaPatricia Ford, RN, Kandice RobinsonsGuillaume                            [place], Technician Referring MD:              Medicines:                Monitored Anesthesia Care Complications:            No immediate complications. Estimated blood loss:                            None. Estimated Blood Loss:     Estimated blood loss: none. Procedure:                Pre-Anesthesia Assessment:                           - Prior to the procedure, a History and Physical                            was performed, and patient medications and                            allergies were reviewed. The patient's tolerance of                            previous anesthesia was also reviewed. The risks                            and benefits of the procedure and the sedation                            options and risks were discussed with the patient.                            All questions were answered, and informed consent                            was obtained. Prior Anticoagulants: The patient has                            taken no previous anticoagulant or antiplatelet                            agents. ASA Grade Assessment: II - A patient with  mild systemic disease. After reviewing the risks                            and benefits, the patient was deemed in                            satisfactory condition to undergo the procedure.                           After obtaining informed consent, the endoscope was                            passed under direct vision. Throughout the                            procedure, the patient's  blood pressure, pulse, and                            oxygen saturations were monitored continuously. The                            EG-[id number] ([id number]) scope was introduced through the                            mouth, and advanced to the second part of duodenum.                            The upper GI endoscopy was accomplished without                            difficulty. The patient tolerated the procedure                            well. Scope In: Scope Out: Findings:      A small hiatal hernia was present.      The exam was otherwise without abnormality. No blood in the UGI tract. Impression:               - Small hiatal hernia.                           - The examination was otherwise normal.                           - No specimens collected. Moderate Sedation:      none Recommendation:           - Return patient to hospital ward for ongoing care.                            Probable d/c tomorrow.                           - Resume regular diet.                           -  My office will arrange follow up appointment in                            5-6 weeks, repeat CBC at that time.                           - Once daily OTC iron supplement for now. Procedure Code(s):        --- Professional ---                           [phone], Esophagogastroduodenoscopy, flexible,                            transoral; diagnostic, including collection of                            specimen(s) by brushing or washing, when performed                            (separate procedure) Diagnosis Code(s):        --- Professional ---                           K44.9, Diaphragmatic hernia without obstruction or                            gangrene                           D50.9, Iron deficiency anemia, unspecified                           K92.0, Hematemesis CPT copyright 2016 American [employer] Association. All rights reserved. The codes documented in this report are preliminary and upon coder review may   be revised to meet current compliance requirements. Rachael Feeaniel P Dayveon Halley, MD 06/17/2016 1:58:51 PM This report has been signed electronically. Number of Addenda: 0

## 2016-06-17 NOTE — H&P (View-Only) (Signed)
Gastroenterology Consult: 10:54 AM 06/17/2016  LOS: 0 days    Referring Provider: Dr  Mikey BussingHoffman  Primary Care Physician:  [name],[name], MD Primary Gastroenterologist:  Domenick Bookbinderr.[name]     Reason for Consultation:  Dark emesis.     HPI: Angela Coleman is a 82 y.o. female.  PMH HTN.  HLD.    2008 colonoscopy avg risk screening (however patient advises me that her mother developed colon cancer in her late 6660s versus early 5270s). Dr. Jarold MottoPatterson. Normal study. 2001 colonoscopy.  Dr. Melvia Heapsobert Kaplan.  Scattered diverticulosis. Patient does not recall previous upper endoscopy.  [facility].  She spends the day there. She didn't eat as much lunch as usual, her appetite was suppressed so she just ate sweep potatoes. Then she participated in chair exercise, which consists of hand weights, some light aerobics and had just finished exercising when she felt acutely tired and weak.  She proceeded to have 3 or 4 episodes in quick succession. Initially it was brown, at the end there was some streaks of blood visible.  Her nausea resolved and has not returned. She had a bowel movement earlier in the morning and it was formed and brown per usual. Hgb 9.6 >> 7.9 this AM.  MCV 79.  Hgb in 2012,  2013 ~ 12.5.  Ferritin 17 BUN 32 >> 23.   Creatinines normal.  Troponin I nml x 2.  LFTs normal.    Home meds include 81 mg aspirin. No PPI or H2 blocker.  She does not use NSAIDs.  Since admission she has received a dose of IV Protonix and is now on oral, twice daily Protonix. Patient moves her bowels about every other day. She does not see blood in her stools.  No melena. No abdominal pain. Appetite is always good. No trouble swallowing. No heartburn or indigestion. She denies unusual bleeding or bruising. Hasn't had  any nosebleeds. Has never received blood transfusion. As a younger woman she may have taken iron supplements.   Past Medical History:  Diagnosis Date  . Bell's palsy   . Disorder of bone and cartilage, unspecified   . Diverticulosis of colon (without mention of hemorrhage)   . Generalized osteoarthrosis, unspecified site   . Paralytic strabismus, sixth or abducens nerve palsy   . Pure hypercholesterolemia   . Sebaceous cyst   . Unspecified essential hypertension   . Unspecified hearing loss   . Unspecified venous (peripheral) insufficiency   . Unspecified vitamin D deficiency     Past Surgical History:  Procedure Laterality Date  . CATARACT EXTRACTION    . VESICOVAGINAL FISTULA CLOSURE W/ TAH  1974    Prior to Admission medications   Medication Sig Start Date End Date Taking? Authorizing Provider  aspirin 81 MG tablet Take 81 mg by mouth daily.      Historical Provider, MD  calcium-vitamin D (OSCAL WITH D) 500-200 MG-UNIT per tablet Take 1 tablet by mouth daily.      Historical Provider, MD  Cholecalciferol (VITAMIN D) 1000 UNITS capsule Take 1,000  Units by mouth daily.      Historical Provider, MD  furosemide (LASIX) 40 MG tablet TAKE 1 TABLET BY MOUTH EVERY DAY Patient taking differently: TAKE 2 TABLET BY MOUTH EVERY DAY 01/31/13   Michele McalpineScott M Nadel, MD  Multiple Vitamins-Minerals (WOMENS MULTIVITAMIN PLUS) TABS Take 1 tablet by mouth daily.      Historical Provider, MD    Scheduled Meds: . docusate sodium  100 mg Oral BID  . pantoprazole  40 mg Oral BID   Infusions:  PRN Meds: acetaminophen **OR** acetaminophen, ondansetron **OR** ondansetron (ZOFRAN) IV   Allergies as of 06/16/2016  . (No Known Allergies)    Family History  Problem Relation Age of Onset  . Diabetes Father     Social History   Social History  . Marital status: Widowed    Spouse name: N/A  . Number of children: 5  . Years of education: N/A   Occupational History  . retired    Social History  Main Topics  . Smoking status: Never Smoker  . Smokeless tobacco: Never Used  . Alcohol use No  . Drug use: No  . Sexual activity: Not on file   Other Topics Concern  . Not on file   Social History Narrative  . No narrative on file    REVIEW OF SYSTEMS: Constitutional:  Generally she has pretty good activity level, she exercises at the pace program twice a week. ENT:  No nose bleeds Pulm:  No cough or dyspnea CV:  No palpitations, no LE edema.  No chest pain GU:  No hematuria, no frequency GI:  Per HPI Heme:  Per HPI   Transfusions:  Per HPI Neuro:  No headaches, no peripheral tingling or numbness.  No dizziness. Sometimes she is forgetful but she has not had any overt confusion.  Derm:  No itching, no rash or sores.  Endocrine:  No sweats or chills.  No polyuria or dysuria Immunization:  Patient is up-to-date on her flu shot. Travel:  None beyond local counties in last few months.    PHYSICAL EXAM: Vital signs in last 24 hours: Vitals:   06/17/16 0610 06/17/16 0900  BP: (!) 103/45 (!) 116/49  Pulse: (!) 54 (!) 54  Resp: 17 18  Temp: 98.3 F (36.8 C) 98.8 F (37.1 C)   Wt Readings from Last 3 Encounters:  06/16/16 64.7 kg (142 lb 11.2 oz)  03/24/16 65.3 kg (144 lb)  02/06/12 70.9 kg (156 lb 6.4 oz)    General: Pleasant, elderly WF who looks great for her 89 years. Head:  No facial asymmetry or edema. No signs of head trauma.  Eyes:  No scleral icterus, no conjunctival pallor. Ears:  Mild hearing loss  Nose:  No discharge or congestion Mouth:  Many teeth are gone. She wears partial plates but these are not currently in place. Neck:  No JVD, no masses. No thyromegaly. Lungs:  Clear bilaterally. No dyspnea or cough Heart:  RRR. 2/6 SEM.  S1, S2 present. Abdomen:  Soft. Nontender. Active bowel sounds. No HSM, no masses. No bruits or hernias..   Rectal:  Deferred.   Musc/Skeltl:  No joint swelling or erythema. Some kyphosis. Some arthritic changes in the  fingers Extremities:  Minor slightly pitting lower extremity edema.  Neurologic:  Patient is alert. She is a good historian. Fluid speech She is oriented 3. No tremor. Moves all 4 limbs, limb strength not tested. Skin:  No sores, rashes or telangiectasia. Nodes:  No cervical adenopathy.  Psych:  Calm, cooperative, affect normal.  Intake/Output from previous day: 12/21 0701 - 12/22 0700 In: 0  Out: 325 [Urine:325] Intake/Output this shift: Total I/O In: 0  Out: 300 [Urine:300]  LAB RESULTS:  Recent Labs  06/16/16 1605 06/17/16 0624  WBC 6.9 3.7*  HGB 9.6* 7.9*  HCT 29.6* 24.3*  PLT 207 165   BMET Lab Results  Component Value Date   NA 141 06/17/2016   NA 143 06/16/2016   NA 140 08/08/2011   K 2.8 (L) 06/17/2016   K 3.5 06/16/2016   K 4.4 08/08/2011   CL 104 06/17/2016   CL 103 06/16/2016   CL 104 08/08/2011   CO2 30 06/17/2016   CO2 31 06/16/2016   CO2 31 08/08/2011   GLUCOSE 88 06/17/2016   GLUCOSE 128 (H) 06/16/2016   GLUCOSE 100 (H) 08/08/2011   BUN 23 (H) 06/17/2016   BUN 32 (H) 06/16/2016   BUN 13 08/08/2011   CREATININE 1.00 06/17/2016   CREATININE 0.87 06/16/2016   CREATININE 0.9 08/08/2011   CALCIUM 8.2 (L) 06/17/2016   CALCIUM 8.5 (L) 06/16/2016   CALCIUM 9.1 08/08/2011   LFT  Recent Labs  06/16/16 1605 06/17/16 0624  PROT 6.6 5.5*  ALBUMIN 3.3* 2.8*  AST 18 16  ALT 9* 7*  ALKPHOS 59 46  BILITOT 0.4 0.6   PT/INR Lab Results  Component Value Date   INR 1.25 06/17/2016   INR 1.13 06/16/2016   Hepatitis Panel No results for input(s): HEPBSAG, HCVAB, HEPAIGM, HEPBIGM in the last 72 hours. C-Diff No components found for: CDIFF Lipase  No results found for: LIPASE  Drugs of Abuse  No results found for: LABOPIA, COCAINSCRNUR, LABBENZ, AMPHETMU, THCU, LABBARB   RADIOLOGY STUDIES: No results found.    IMPRESSION:   *  Dark emesis with small streaks of blood. Rule out ulcer disease. Rule out AVMs. Less likely this represents a  Mallory-Weiss tear though this is in the differential diagnosis. Patient generally does not have GI issues.  *  Normocytic anemia. Not sure how acute this is as the only reference hemoglobin/hematocrit we have is from 2013.  No transfusions to date.  *  Azotemia. Consistent with upper GI bleed.  *  Hypokalemia, just finishing dose of liquid potassium as we spoke.   PLAN:     *  Needs EGD. We'll try to arrange this for today.  *  Continue twice daily oral Protonix.  *  Serial CBC. Anemia labs in the morning   Jennye Moccasin  06/17/2016, 10:54 AM Pager: [phone]    ________________________________________________________________________  Cor[facility] MD note:  I personally examined the patient, reviewed the data and agree with the assessment and plan described above.  Planning for EGD today for UGI bleed.   Rob Bunting, MD [facility] Gastroenterology Pager [phone]

## 2016-06-17 NOTE — Progress Notes (Signed)
Paged physician to report Hgb of 7.9. Awaiting response.   Leonia ReevesNatalie N Zackari Ruane, RN

## 2016-06-17 NOTE — Anesthesia Preprocedure Evaluation (Signed)
Anesthesia Evaluation  Patient identified by MRN, date of birth, ID band Patient awake    Reviewed: Allergy & Precautions, NPO status , Patient's Chart, lab work & pertinent test results  Airway Mallampati: II  TM Distance: >3 FB Neck ROM: Full    Dental no notable dental hx.    Pulmonary neg pulmonary ROS,    Pulmonary exam normal breath sounds clear to auscultation       Cardiovascular hypertension, + Peripheral Vascular Disease  negative cardio ROS Normal cardiovascular exam Rhythm:Regular Rate:Normal     Neuro/Psych negative neurological ROS  negative psych ROS   GI/Hepatic negative GI ROS, Neg liver ROS,   Endo/Other  negative endocrine ROS  Renal/GU negative Renal ROS     Musculoskeletal  (+) Arthritis ,   Abdominal   Peds  Hematology negative hematology ROS (+)   Anesthesia Other Findings   Reproductive/Obstetrics                             Anesthesia Physical Anesthesia Plan  ASA: III  Anesthesia Plan: MAC   Post-op Pain Management:    Induction: Intravenous  Airway Management Planned:   Additional Equipment:   Intra-op Plan:   Post-operative Plan:   Informed Consent: I have reviewed the patients History and Physical, chart, labs and discussed the procedure including the risks, benefits and alternatives for the proposed anesthesia with the patient or authorized representative who has indicated his/her understanding and acceptance.   Dental advisory given  Plan Discussed with: CRNA  Anesthesia Plan Comments:         Anesthesia Quick Evaluation

## 2016-06-17 NOTE — Progress Notes (Signed)
OT Cancellation Note  Patient Details Name: Angela GaudierFrances Akamine MRN: [id number] DOB: [birth year]   Cancelled Treatment:     Will hold evaluation today secondary to Hgb 7.9.   Tahesha Skeet 06/17/2016, 9:45 AM

## 2016-06-17 NOTE — Evaluation (Signed)
Physical Therapy Evaluation Patient Details Name: Angela Coleman MRN: [id number] DOB: 1921/08/12 Today's Date: 06/17/2016   History of Present Illness  Angela GaudierFrances Coleman is a 82 year old female with PMH of HTN, HLD, osteopenia, and diverticulosis (jnoted on colonoscopy in 2001) who presented to the ED for hematemesis.   Clinical Impression  Patient presents close to functional baseline.  Feel safe for hallway ambulation with nursing assist.  No further acute PT needs.  [facility] d[facility]c home with family support.     Follow Up Recommendations Other (comment) ([facility]    Equipment Recommendations  None recommended by PT    Recommendations for Other Services       Precautions / Restrictions Precautions Precautions: Fall Precaution Comments: uses cane at home, walker in community, no recent falls Restrictions Weight Bearing Restrictions: No      Mobility  Bed Mobility Overal bed mobility: Modified Independent                Transfers Overall transfer level: Modified independent (with heavy UE use) Equipment used: Rolling walker (2 wheeled)                Ambulation/Gait Ambulation/Gait assistance: Modified independent (Device/Increase time) Ambulation Distance (Feet): 300 Feet Assistive device: Rolling walker (2 wheeled) Gait Pattern/deviations: Step-through pattern;Decreased stride length;Wide base of support     General Gait Details: no LOB, appropriate use of RW  Stairs            Wheelchair Mobility    Modified Rankin (Stroke Patients Only)       Balance Overall balance assessment: Needs assistance         Standing balance support: During functional activity;No upper extremity supported Standing balance-Leahy Scale: Fair Standing balance comment: during toilet hygiene and donning briefs                             Pertinent Vitals/Pain Pain Assessment: Faces Faces Pain Scale: Hurts little  more Pain Location: R knee with ambulation Pain Descriptors / Indicators: Aching Pain Intervention(s): Monitored during session;Repositioned    Home Living Family/patient expects to be discharged to:: Private residence Living Arrangements: Children Available Help at Discharge: Family Type of Home: House Home Access: Level entry     Home Layout: One level Home Equipment: Environmental consultantWalker - 2 wheels;Cane - single point;Grab bars - tub/shower;Shower seat;Hand held shower head      Prior Function Level of Independence: Independent with assistive device(s)               Hand Dominance        Extremity/Trunk Assessment   Upper Extremity Assessment Upper Extremity Assessment: Overall WFL for tasks assessed    Lower Extremity Assessment Lower Extremity Assessment: Generalized weakness    Cervical / Trunk Assessment Cervical / Trunk Assessment: Kyphotic  Communication   Communication: No difficulties  Cognition Arousal/Alertness: Awake/alert Behavior During Therapy: WFL for tasks assessed/performed Overall Cognitive Status: Within Functional Limits for tasks assessed                      General Comments      Exercises     Assessment/Plan    PT Assessment Patent does not need any further PT services  PT Problem List            PT Treatment Interventions      PT Goals (Current goals can  be found in the Care Plan section)  Acute Rehab PT Goals PT Goal Formulation: All assessment and education complete, DC therapy    Frequency     Barriers to discharge        Co-evaluation               End of Session Equipment Utilized During Treatment: Gait belt Activity Tolerance: Patient tolerated treatment well Patient left: in bed;with call bell/phone within reach;with family/visitor present      Functional Assessment Tool Used: Clinical Judgement Functional Limitation: Mobility: Walking and moving around Mobility: Walking and Moving Around Current  Status ([phone](G8978): At least 1 percent but less than 20 percent impaired, limited or restricted Mobility: Walking and Moving Around Goal Status [phone](G8979): At least 1 percent but less than 20 percent impaired, limited or restricted Mobility: Walking and Moving Around Discharge Status [phone](G8980): At least 1 percent but less than 20 percent impaired, limited or restricted    Time: 1140-1200 PT Time Calculation (min) (ACUTE ONLY): 20 min   Charges:   PT Evaluation $PT Eval Low Complexity: 1 Procedure     PT G Codes:   PT G-Codes **NOT FOR INPATIENT CLASS** Functional Assessment Tool Used: Clinical Judgement Functional Limitation: Mobility: Walking and moving around Mobility: Walking and Moving Around Current Status ([id number](G8978): At least 1 percent but less than 20 percent impaired, limited or restricted Mobility: Walking and Moving Around Goal Status [phone](G8979): At least 1 percent but less than 20 percent impaired, limited or restricted Mobility: Walking and Moving Around Discharge Status [phone](G8980): At least 1 percent but less than 20 percent impaired, limited or restricted    Elray McgregorCynthia Ninnie Fein 06/17/2016, 12:31 PM  Sheran Lawlessyndi Alim Cattell, PT [phone] 06/17/2016

## 2016-06-18 LAB — BASIC METABOLIC PANEL
Anion gap: 3 — ABNORMAL LOW (ref 5–15)
BUN: 11 mg/dL (ref 6–20)
CALCIUM: 8.5 mg/dL — AB (ref 8.9–10.3)
CO2: 28 mmol/L (ref 22–32)
CREATININE: 0.96 mg/dL (ref 0.44–1.00)
Chloride: 111 mmol/L (ref 101–111)
GFR calc Af Amer: 58 mL/min — ABNORMAL LOW (ref >60)
GFR, EST NON AFRICAN AMERICAN: 50 mL/min — AB (ref >60)
GLUCOSE: 83 mg/dL (ref 65–99)
Potassium: 4.3 mmol/L (ref 3.5–5.1)
Sodium: 142 mmol/L (ref 135–145)

## 2016-06-18 LAB — IRON AND TIBC
Iron: 34 ug/dL (ref 28–170)
SATURATION RATIOS: 12 % (ref 10.4–31.8)
TIBC: 277 ug/dL (ref 250–450)
UIBC: 243 ug/dL

## 2016-06-18 LAB — CBC
HEMATOCRIT: 25.1 % — AB (ref 36.0–46.0)
HEMATOCRIT: 26.1 % — AB (ref 36.0–46.0)
Hemoglobin: 8.1 g/dL — ABNORMAL LOW (ref 12.0–15.0)
Hemoglobin: 8.4 g/dL — ABNORMAL LOW (ref 12.0–15.0)
MCH: 26.3 pg (ref 26.0–34.0)
MCH: 26.3 pg (ref 26.0–34.0)
MCHC: 32.3 g/dL (ref 30.0–36.0)
MCHC: 32.2 g/dL (ref 30.0–36.0)
MCV: 81.5 fL (ref 78.0–100.0)
MCV: 81.6 fL (ref 78.0–100.0)
Platelets: 171 10*3/uL (ref 150–400)
Platelets: 185 10*3/uL (ref 150–400)
RBC: 3.08 MIL/uL — AB (ref 3.87–5.11)
RBC: 3.2 MIL/uL — AB (ref 3.87–5.11)
RDW: 14.8 % (ref 11.5–15.5)
RDW: 14.9 % (ref 11.5–15.5)
WBC: 3.8 10*3/uL — AB (ref 4.0–10.5)
WBC: 4.3 10*3/uL (ref 4.0–10.5)

## 2016-06-18 LAB — GLUCOSE, CAPILLARY: Glucose-Capillary: 89 mg/dL (ref 65–99)

## 2016-06-18 LAB — RETICULOCYTES
RBC.: 3.08 MIL/uL — AB (ref 3.87–5.11)
RETIC COUNT ABSOLUTE: 30.8 10*3/uL (ref 19.0–186.0)
RETIC CT PCT: 1 % (ref 0.4–3.1)

## 2016-06-18 LAB — VITAMIN B12: Vitamin B-12: 839 pg/mL (ref 180–914)

## 2016-06-18 LAB — FOLATE: Folate: 35.3 ng/mL (ref >5.9)

## 2016-06-18 LAB — VITAMIN D 25 HYDROXY (VIT D DEFICIENCY, FRACTURES): Vit D, 25-Hydroxy: 34.5 ng/mL (ref 30.0–100.0)

## 2016-06-18 LAB — MAGNESIUM: Magnesium: 2.1 mg/dL (ref 1.7–2.4)

## 2016-06-18 MED ORDER — PANTOPRAZOLE SODIUM 40 MG PO TBEC: 40.0000 mg | DELAYED_RELEASE_TABLET | Freq: Two times a day (BID) | ORAL | 0 refills | Status: DC

## 2016-06-18 MED ORDER — FERROUS SULFATE 325 (65 FE) MG PO TABS: 325.0000 mg | ORAL_TABLET | Freq: Every day | ORAL | 3 refills | Status: AC

## 2016-06-18 NOTE — Progress Notes (Signed)
   Subjective: Patient was feeling better this morning when seen. Eating her breakfast. Denies any more vomiting or hematemesis she denies any abdominal pain.  Objective:  Vital signs in last 24 hours: Vitals:   06/17/16 1700 06/17/16 2112 06/18/16 0618 06/18/16 1007  BP: 100/76 (!) 97/54 106/60 (!) 113/53  Pulse: 69 65 (!) 54 (!) 59  Resp:  17 16 16   Temp: 98.4 F (36.9 C) 98.9 F (37.2 C) 98.4 F (36.9 C) 98.4 F (36.9 C)  TempSrc: Oral Oral Oral Oral  SpO2: 100% 100% 100% 98%  Weight:      Height:       Gen. pleasant elderly lady, sitting comfortably on the side of her bed, in no acute distress Chest. Clear bilaterally. CV. Regular rate and rhythm, 2/6 systolic murmur. Abdomen. Soft, nontender, nondistended, bowel sounds positive. Extremities. No edema, no cyanosis, pulses 2+ bilaterally.  Labs. CBC Latest Ref Rng & Units 06/18/2016 06/17/2016 06/17/2016  WBC 4.0 - 10.5 K/uL 3.8(L) 4.2 3.8(L)  Hemoglobin 12.0 - 15.0 g/dL 8.1(L) 7.6(L) 8.4(L)  Hematocrit 36.0 - 46.0 % 25.1(L) 23.3(L) 26.0(L)  Platelets 150 - 400 K/uL 171 167 188   BMP Latest Ref Rng & Units 06/18/2016 06/17/2016 06/17/2016  Glucose 65 - 99 mg/dL 83 - 88  BUN 6 - 20 mg/dL 11 - 65(H23(H)  Creatinine 0.44 - 1.00 mg/dL 8.460.96 - 9.621.00  Sodium 952135 - 145 mmol/L 142 - 141  Potassium 3.5 - 5.1 mmol/L 4.3 3.1(L) 2.8(L)  Chloride 101 - 111 mmol/L 111 - 104  CO2 22 - 32 mmol/L 28 - 30  Calcium 8.9 - 10.3 mg/dL 8.4(X8.5(L) - 8.2(L)   Mag.2.1 Iron/TIBC/Ferritin/ %Sat    Component Value Date/Time   IRON 34 06/18/2016 0626   TIBC 277 06/18/2016 0626   FERRITIN 17 06/17/2016 0029   IRONPCTSAT 12 06/18/2016 0626   Folate. 35.2  B12. 839  Assessment/Plan:   Judd GaudierFrances Armendariz is a 82 year old female with PMH of HTN, HLD, osteopenia, and diverticulosis (jnoted on colonoscopy in 2001) who presented to the ED for hematemesis.     Upper GI bleed. She had her EGD done yesterday, which shows small hiatal hernia, there was no  bleeding noted. Her hemoglobin improved to 8.1 today from 7.6 yesterday, BUN normalized. Her iron studies, folate and B12 levels are within normal limits. Reticulocyte counts are within normal limits. Her baseline hemoglobin is around 11.5. -Keep holding aspirin. -Start her on iron supplement. -Continue protonix. -She will follow up with GI in 5-6 weeks. -She can be discharged home after GI follow-up today.  Hypertension. Her blood pressure remains on softer side. Keep holding her home antihypertensives of amlodipine and Lasix. She had her upcoming appointment with PCP on Tuesday, December 26. They can reassess her blood pressure at that time.  Hypokalemia. Resolved.   Dispo: Can be discharged today after GI sees her.  Arnetha CourserSumayya Taquana Bartley, MD 06/18/2016, 11:11 AM Pager: [phone]

## 2016-06-18 NOTE — Progress Notes (Signed)
Discharge instructions and medications discussed with patient.  All questions answered.  

## 2016-06-18 NOTE — Discharge Summary (Signed)
Name: Angela Coleman MRN: [id number] DOB: 1922/03/09 82 y.o. PCP: Jethro Bastosobert N Koehler, MD  Date of Admission: 06/16/2016  2:58 PM Date of Discharge: 06/19/2016 Attending Physician: No att. providers found  Discharge Diagnosis: 1. Upper GI bleed Principal Problem:   GI bleed Active Problems:   HYPERCHOLESTEROLEMIA   Essential hypertension   Diverticulosis of colon   Hematemesis   Hiatal hernia   Discharge Medications: Allergies as of 06/18/2016   No Known Allergies     Medication List    STOP taking these medications   amLODipine 10 MG tablet Commonly known as:  NORVASC   aspirin EC 81 MG tablet   furosemide 40 MG tablet Commonly known as:  LASIX     TAKE these medications   acetaminophen 325 MG tablet Commonly known as:  TYLENOL Take 650 mg by mouth 3 (three) times daily as needed for headache (pain).   antiseptic oral rinse Liqd 5 mLs by Mouth Rinse route at bedtime.   calcium carbonate (dosed in mg elemental calcium) 1250 MG/5ML Susp Take 500 mg of elemental calcium by mouth daily with breakfast.   ferrous sulfate 325 (65 FE) MG tablet Commonly known as:  FERROUSUL Take 1 tablet (325 mg total) by mouth daily with breakfast.   Glucosamine-Chondroitin 1500-1200 MG/30ML Liqd Take 30 mLs by mouth daily.   isosorbide mononitrate 30 MG 24 hr tablet Commonly known as:  IMDUR Take 30 mg by mouth daily.   OVER THE COUNTER MEDICATION Place 1 drop into both eyes daily as needed (dry eyes). Bausch and Lomb advanced eye relief - OTC   pantoprazole 40 MG tablet Commonly known as:  PROTONIX Take 1 tablet (40 mg total) by mouth 2 (two) times daily.   sennosides-docusate sodium 8.6-50 MG tablet Commonly known as:  SENOKOT-S Take 1 tablet by mouth at bedtime.   Vitamin D 1000 units capsule Take 1,000 Units by mouth daily.       Disposition and follow-up:   [name] [facility].  At the hospital  follow up visit please address:  1.  Any recurrence of hematemesis or melena. -Please reassess her blood pressure and restart her home antihypertensives accordingly.  2.  Labs / imaging needed at time of follow-up: CBC  3.  Pending labs/ test needing follow-up: None  Follow-up Appointments:   Hospital Course by problem list:   Angela Coleman is a 82 year old female with PMH of HTN, HLD, osteopenia, and diverticulosis (jnoted on colonoscopy in 2001) who presented to the ED for hematemesis.  1. Upper GI bleed. She had her EGD done which shows small hiatal hernia without any sign of bleeding. Not sure about the cause of her bleeding, as her hemoglobin dropped to 7.6. Her baseline was 11. Her hemoglobin remained stable and it was 8.4 without any transfusion on discharge. She remained asymptomatic without any vomiting or hematemesis during her stay. She was tolerating her diet very well.  She will follow up with GI in 4-6 weeks. She was started on iron supplement and continue with Protonix according to GI recommendations.  2. Hypertension. Her blood pressure remain on softer side during her stay in hospital. We held her home antihypertensives. Patient states that she has an upcoming appointment with her PCP on Tuesday, December 26, she was told to keep holding her amlodipine and Lasix. Her PCP can reassess her blood pressure and restart her antihypertensives if needed.  3. Hypokalemia. She had hypokalemia on  admission, with potassium of 2.8. Which was resolved with repletion. Her potassium on discharge was 4.3.  Discharge Vitals:   BP (!) 113/53 (BP Location: Left Arm)   Pulse (!) 59   Temp 98.4 F (36.9 C) (Oral)   Resp 16   Ht 5\' 4"  (1.626 m)   Wt 64.7 kg (142 lb 11.2 oz)   SpO2 98%   BMI 24.49 kg/m   Pertinent Labs, Studies, and Procedures:   CBC Latest Ref Rng & Units 06/18/2016 06/18/2016 06/17/2016  WBC 4.0 - 10.5 K/uL 4.3 3.8(L) 4.2  Hemoglobin 12.0 - 15.0 g/dL 5.4(U8.4(L) 8.1(L)  7.6(L)  Hematocrit 36.0 - 46.0 % 26.1(L) 25.1(L) 23.3(L)  Platelets 150 - 400 K/uL 185 171 167   BMP Latest Ref Rng & Units 06/18/2016 06/17/2016 06/17/2016  Glucose 65 - 99 mg/dL 83 - 88  BUN 6 - 20 mg/dL 11 - 98(J23(H)  Creatinine 0.44 - 1.00 mg/dL 1.910.96 - 4.781.00  Sodium 295135 - 145 mmol/L 142 - 141  Potassium 3.5 - 5.1 mmol/L 4.3 3.1(L) 2.8(L)  Chloride 101 - 111 mmol/L 111 - 104  CO2 22 - 32 mmol/L 28 - 30  Calcium 8.9 - 10.3 mg/dL 6.2(Z8.5(L) - 8.2(L)   Iron/TIBC/Ferritin/ %Sat    Component Value Date/Time   IRON 34 06/18/2016 0626   TIBC 277 06/18/2016 0626   FERRITIN 17 06/17/2016 0029   IRONPCTSAT 12 06/18/2016 0626   Folate. 35.3 B12.  839 Mag. 2.1 FOBT. Negative.  Discharge Instructions: Discharge Instructions    Diet - low sodium heart healthy    Complete by:  As directed    Discharge instructions    Complete by:  As directed    It was pleasure taking care of you. Please hold your blood pressure medicines, amlodipine and Lasix, till You see your primary care next Tuesday. Your primary care physician should be able to really adjust them. I'm also giving you a prescription for iron supplement, please take it with food. Also take your stool softeners with that as that can make you constipated. Iron will also make sure stool dark colored. Please follow-up with gastroenterology in 4-5 weeks as directed. Please see medical assistance if you experience any more vomiting with blood sees blood in stool.   Increase activity slowly    Complete by:  As directed       Signed: Arnetha CourserSumayya Bristol Soy, MD 06/19/2016, 12:37 PM   Pager: [phone]

## 2016-06-18 NOTE — Discharge Instructions (Signed)
It was pleasure taking care of you. Please hold your blood pressure medicines, amlodipine and Lasix, till You see your primary care next Tuesday. Your primary care physician should be able to really adjust them. I'm also giving you a prescription for iron supplement, please take it with food. Also take your stool softeners with that as that can make you constipated. Iron will also make sure stool dark colored. Please follow-up with gastroenterology in 4-5 weeks as directed. Please see medical assistance if you experience any more vomiting with blood sees blood in stool.

## 2016-06-18 NOTE — Progress Notes (Signed)
Internal Medicine Attending:   I saw and examined the patient. I reviewed the resident's note and I agree with the resident's findings and plan as documented in the resident's note. Patient underwent EGD yesterday, normal other than hiatal hernia, no blood seen.   It is unclear what caused her hematemesis and positive FOBT.  However patient has not had any recurrence, her Hgb is stable, and she feels well.  She reports that she would like to be discharged if possible to get home for christmas.  She [facility] and plans to be seen for follow up there on the 26th.  GI has recommended starting once daily ferrous sulfate which we will do.  We will check with GI today to ensure there are no further recommendation and that she may be discharge to follow up with them in the office in a few weeks for further workup of her positive fobt.  On exam, she is very pleasant, has no abdominal tenderness, has moist mucus  membranes and no skin tenting, she has no lower extremity edema. Otherwise her potassium has been replaced and is now wnl.  Her magnesium was checked and is normal.  Our residents checked a vitamin D on admission due to a previously low level 4 years ago and is now normal (she now takes 1000units of Vitamin D daily).

## 2016-08-12 ENCOUNTER — Ambulatory Visit (INDEPENDENT_AMBULATORY_CARE_PROVIDER_SITE_OTHER): Payer: Medicare (Managed Care) | Admitting: Gastroenterology

## 2016-08-12 ENCOUNTER — Encounter: Payer: Self-pay | Admitting: Gastroenterology

## 2016-08-12 ENCOUNTER — Other Ambulatory Visit (INDEPENDENT_AMBULATORY_CARE_PROVIDER_SITE_OTHER): Payer: Medicare (Managed Care)

## 2016-08-12 VITALS — BP 120/70 | HR 63 | Ht 61.0 in | Wt 147.0 lb

## 2016-08-12 DIAGNOSIS — D649 Anemia, unspecified: Secondary | ICD-10-CM

## 2016-08-12 DIAGNOSIS — R7989 Other specified abnormal findings of blood chemistry: Secondary | ICD-10-CM

## 2016-08-12 DIAGNOSIS — K920 Hematemesis: Secondary | ICD-10-CM

## 2016-08-12 LAB — CBC WITH DIFFERENTIAL/PLATELET
BASOS PCT: 0.6 % (ref 0.0–3.0)
Basophils Absolute: 0 10*3/uL (ref 0.0–0.1)
EOS PCT: 2.8 % (ref 0.0–5.0)
Eosinophils Absolute: 0.1 10*3/uL (ref 0.0–0.7)
HEMATOCRIT: 33.8 % — AB (ref 36.0–46.0)
HEMOGLOBIN: 10.9 g/dL — AB (ref 12.0–15.0)
LYMPHS PCT: 21.5 % (ref 12.0–46.0)
Lymphs Abs: 0.8 10*3/uL (ref 0.7–4.0)
MCHC: 32.1 g/dL (ref 30.0–36.0)
MCV: 81.5 fl (ref 78.0–100.0)
MONO ABS: 0.6 10*3/uL (ref 0.1–1.0)
Monocytes Relative: 16.3 % — ABNORMAL HIGH (ref 3.0–12.0)
Neutro Abs: 2.1 10*3/uL (ref 1.4–7.7)
Neutrophils Relative %: 58.8 % (ref 43.0–77.0)
Platelets: 232 10*3/uL (ref 150.0–400.0)
RBC: 4.15 Mil/uL (ref 3.87–5.11)
RDW: 14 % (ref 11.5–15.5)
WBC: 3.6 10*3/uL — AB (ref 4.0–10.5)

## 2016-08-12 MED ORDER — PANTOPRAZOLE SODIUM 40 MG PO TBEC: 40.0000 mg | DELAYED_RELEASE_TABLET | Freq: Every day | ORAL | 0 refills | Status: AC

## 2016-08-12 NOTE — Patient Instructions (Signed)
You will have labs checked today in the basement lab.  Please head down after you check out with the front desk  (cbc). Ok to decrease your protonix to once daily dosing.

## 2016-08-12 NOTE — Progress Notes (Signed)
HPI: This is a very pleasant 83 year old woman.  I met her 2 months ago when she was hospitalized at [facility]. She had been transferred from her nursing home after noted to have some dark emesis that was streaked with red blood. I did upper endoscopy for her December 2017. I saw a small hiatal hernia, the examination was otherwise normal without any overt blood in her stomach. She had no overt bleeding while she was hospitalized.  She was discharged on BID PPI, once daily iron supplement.  Since leaving the hospital her stools have been a bit dark but solid. She's had no overt hematemesis, she is eating well. She has no nausea or vomiting.  She was FOBT negative 1 month ago  Chief complaint is anemia, hematemesis episode December 2017  ROS: complete GI ROS as described in HPI.  Constitutional:  No unintentional weight loss   Past Medical History:  Diagnosis Date  . Bell's palsy   . Disorder of bone and cartilage, unspecified   . Diverticulosis of colon (without mention of hemorrhage)   . Generalized osteoarthrosis, unspecified site   . Paralytic strabismus, sixth or abducens nerve palsy   . Pure hypercholesterolemia   . Sebaceous cyst   . Unspecified essential hypertension   . Unspecified hearing loss   . Unspecified venous (peripheral) insufficiency   . Unspecified vitamin D deficiency     Past Surgical History:  Procedure Laterality Date  . CATARACT EXTRACTION    . ESOPHAGOGASTRODUODENOSCOPY N/A 06/17/2016   Procedure: ESOPHAGOGASTRODUODENOSCOPY (EGD);  Surgeon: Milus Banister, MD;  Location: [facility];  Service: Endoscopy;  Laterality: N/A;  . VESICOVAGINAL FISTULA CLOSURE W/ TAH  1974    Current Outpatient Prescriptions  Medication Sig Dispense Refill  . acetaminophen (TYLENOL) 325 MG tablet Take 650 mg by mouth 3 (three) times daily as needed for headache (pain).    Marland Kitchen antiseptic oral rinse (BIOTENE) LIQD 5 mLs by Mouth Rinse route at bedtime.    .  Calcium Carbonate Antacid (CALCIUM CARBONATE, DOSED IN MG ELEMENTAL CALCIUM,) 1250 MG/5ML SUSP Take 500 mg of elemental calcium by mouth daily with breakfast.    . Cholecalciferol (VITAMIN D) 1000 UNITS capsule Take 1,000 Units by mouth daily.      . ferrous sulfate (FERROUSUL) 325 (65 FE) MG tablet Take 1 tablet (325 mg total) by mouth daily with breakfast. 30 tablet 3  . Glucosamine-Chondroitin 1500-1200 MG/30ML LIQD Take 30 mLs by mouth daily.  12  . isosorbide mononitrate (IMDUR) 30 MG 24 hr tablet Take 30 mg by mouth daily.    Marland Kitchen OVER THE COUNTER MEDICATION Place 1 drop into both eyes daily as needed (dry eyes). Bausch and Lomb advanced eye relief - OTC    . pantoprazole (PROTONIX) 40 MG tablet Take 1 tablet (40 mg total) by mouth 2 (two) times daily. 60 tablet 0  . sennosides-docusate sodium (SENOKOT-S) 8.6-50 MG tablet Take 1 tablet by mouth at bedtime.     No current facility-administered medications for this visit.     Allergies as of 08/12/2016  . (No Known Allergies)    Family History  Problem Relation Age of Onset  . Diabetes Father     Social History   Social History  . Marital status: Widowed    Spouse name: N/A  . Number of children: 5  . Years of education: N/A   Occupational History  . retired    Social History Main Topics  . Smoking status: Never Smoker  .  Smokeless tobacco: Never Used  . Alcohol use No  . Drug use: No  . Sexual activity: Not on file   Other Topics Concern  . Not on file   Social History Narrative  . No narrative on file     Physical Exam: BP 120/70   Pulse 63   Ht _0  (1.549 m)   Wt 147 lb (66.7 kg)   BMI 27.78 kg/m  Constitutional: generally well-appearing Psychiatric: alert and oriented x3 Abdomen: soft, nontender, nondistended, no obvious ascites, no peritoneal signs, normal bowel sounds No peripheral edema noted in lower extremities  Assessment and plan: 83 y.o. female with Resolved hematemesis  She has had dark  stools but I suspect that his iron related. They are solid and not tarry-like. She has had no overt hematemesis, no nausea or vomiting. She is almost 83 years old, she is remarkably mentally sharp but she does walk with a walker. I recommended simply that we check her blood counts again and as long as they are rebounding nicely and I don't think is any need for further testing. I'm going to cut back her proton X to once daily dosing and possibly decrease her iron supplement pending review of her labs as well.  Please see the "Patient Instructions" section for addition details about the plan.  Owens Loffler, MD [facility] 08/12/2016, 11:05 AM

## 2016-08-22 ENCOUNTER — Other Ambulatory Visit: Payer: Self-pay

## 2016-08-22 DIAGNOSIS — D649 Anemia, unspecified: Secondary | ICD-10-CM

## 2018-10-08 ENCOUNTER — Other Ambulatory Visit: Payer: Self-pay | Admitting: Family Medicine

## 2018-10-08 ENCOUNTER — Ambulatory Visit
Admission: RE | Admit: 2018-10-08 | Discharge: 2018-10-08 | Disposition: A | Discharge status: Home / Self Care | Payer: Medicare (Managed Care) | Source: Ambulatory Visit | Attending: Family Medicine | Admitting: Family Medicine

## 2018-10-08 ENCOUNTER — Other Ambulatory Visit: Payer: Self-pay

## 2018-10-08 DIAGNOSIS — M6281 Muscle weakness (generalized): Secondary | ICD-10-CM

## 2018-10-08 DIAGNOSIS — R269 Unspecified abnormalities of gait and mobility: Secondary | ICD-10-CM

## 2018-10-08 DIAGNOSIS — W19XXXA Unspecified fall, initial encounter: Secondary | ICD-10-CM

## 2019-09-02 ENCOUNTER — Ambulatory Visit
Admission: RE | Admit: 2019-09-02 | Discharge: 2019-09-02 | Disposition: A | Discharge status: Home / Self Care | Payer: Medicare (Managed Care) | Source: Ambulatory Visit | Attending: Family Medicine | Admitting: Family Medicine

## 2019-09-02 ENCOUNTER — Other Ambulatory Visit: Payer: Self-pay | Admitting: Family Medicine

## 2019-09-02 DIAGNOSIS — M170 Bilateral primary osteoarthritis of knee: Secondary | ICD-10-CM

## 2019-09-19 ENCOUNTER — Ambulatory Visit: Payer: Medicare (Managed Care) | Attending: Internal Medicine

## 2019-09-19 DIAGNOSIS — Z23 Encounter for immunization: Secondary | ICD-10-CM

## 2019-09-19 NOTE — Progress Notes (Signed)
   Covid-19 Vaccination Clinic  Name:  Angela Coleman    MRN: [id number] DOB: 1921/07/08  09/19/2019  [name] was observed post Covid-19 immunization for 15 minutes without incident. She was provided with Vaccine Information Sheet and instruction to access the V-Safe system.   [name] was instructed to call 911 with any severe reactions post vaccine: Marland Kitchen Difficulty breathing  . Swelling of face and throat  . A fast heartbeat  . A bad rash all over body  . Dizziness and weakness   Immunizations Administered    Name Date Dose VIS Date Route   Pfizer COVID-19 Vaccine 09/19/2019 10:11 AM 0.3 mL 06/07/2019 Intramuscular   Manufacturer: [employer], [employer]   Lot: [id number]   NDC: [address]-3

## 2019-10-14 ENCOUNTER — Ambulatory Visit: Payer: Medicare (Managed Care) | Attending: Internal Medicine

## 2019-10-14 DIAGNOSIS — Z23 Encounter for immunization: Secondary | ICD-10-CM

## 2019-10-14 NOTE — Progress Notes (Signed)
   Covid-19 Vaccination Clinic  Name:  Angela Coleman    MRN: [id number] DOB: [birth year]  10/14/2019  [name] was observed post Covid-19 immunization for 15 minutes without incident. She was provided with Vaccine Information Sheet and instruction to access the V-Safe system.   [name] was instructed to call 911 with any severe reactions post vaccine: Marland Kitchen Difficulty breathing  . Swelling of face and throat  . A fast heartbeat  . A bad rash all over body  . Dizziness and weakness   Immunizations Administered    Name Date Dose VIS Date Route   Pfizer COVID-19 Vaccine 10/14/2019  9:52 AM 0.3 mL 08/21/2018 Intramuscular   Manufacturer: [employer], [employer]   Lot: [id number]   NDC: [address]-8

## 2019-11-05 ENCOUNTER — Other Ambulatory Visit: Payer: Self-pay | Admitting: Family Medicine

## 2019-11-05 ENCOUNTER — Ambulatory Visit
Admission: RE | Admit: 2019-11-05 | Discharge: 2019-11-05 | Disposition: A | Discharge status: Home / Self Care | Payer: Medicare (Managed Care) | Source: Ambulatory Visit | Attending: Family Medicine | Admitting: Family Medicine

## 2019-11-05 DIAGNOSIS — R0781 Pleurodynia: Secondary | ICD-10-CM

## 2019-11-05 DIAGNOSIS — W19XXXA Unspecified fall, initial encounter: Secondary | ICD-10-CM

## 2020-08-06 ENCOUNTER — Telehealth: Payer: Self-pay

## 2020-08-06 NOTE — Telephone Encounter (Signed)
I connected by phone with Judd Gaudier and/or patient's caregiver on 08/06/2020 at 1:29 PM to discuss the potential vaccination through our Homebound vaccination initiative.   Prevaccination Checklist for COVID-19 Vaccines  1.  Are you feeling sick today? no  2.  Have you ever received a dose of a COVID-19 vaccine?  yes      If yes, which one? Pfizer   How many dose of Covid-19 vaccine have your received and dates ? 2, 09/19/2019, 10/14/2019   Check all that apply: I live in a long-term care setting. no  I have been diagnosed with a medical condition(s). Please list: 87 yr old female (pertinent to homebound status)  I am a first responder. no  I work in a long-term care facility, correctional facility, hospital, restaurant, retail setting, school, or other setting with high exposure to the public. no  4. Do you have a health condition or are you undergoing treatment that makes you moderately or severely immunocompromised? (This would include treatment for cancer or HIV, receipt of organ transplant, immunosuppressive therapy or high-dose corticosteroids, CAR-T-cell therapy, hematopoietic cell transplant [HCT], DiGeorge syndrome or Wiskott-Aldrich syndrome)  no  5. Have you received hematopoietic cell transplant (HCT) or CAR-T-cell therapies since receiving COVID-19 vaccine? no  6.  Have you ever had an allergic reaction: (This would include a severe reaction [ e.g., anaphylaxis] that required treatment with epinephrine or EpiPen or that caused you to go to the hospital.  It would also include an allergic reaction that occurred within 4 hours that caused hives, swelling, or respiratory distress, including wheezing.) A.  A previous dose of COVID-19 vaccine. no  B.  A vaccine or injectable therapy that contains multiple components, one of which is a COVID-19 vaccine component, but it is not known which component elicited the immediate reaction. no  C.  Are you allergic to polyethylene glycol? no  D.  Are you allergic to Polysorbate, which is found in some vaccines, film coated tablets and intravenous steroids?  no   7.  Have you ever had an allergic reaction to another vaccine (other than COVID-19 vaccine) or an injectable medication? (This would include a severe reaction [ e.g., anaphylaxis] that required treatment with epinephrine or EpiPen or that caused you to go to the hospital.  It would also include an allergic reaction that occurred within 4 hours that caused hives, swelling, or respiratory distress, including wheezing.)  no   8.  Have you ever had a severe allergic reaction (e.g., anaphylaxis) to something other than a component of the COVID-19 vaccine, or any vaccine or injectable medication?  This would include food, pet, venom, environmental, or oral medication allergies.  no   Check all that apply to you:  Am a female between ages 29 and 63 years old  no  Women 52 through 85 years of age can receive any FDA-authorized or -approved COVID-19 vaccine. However, they should be informed of the rare but increased risk of thrombosis with thrombocytopenia syndrome (TTS) after receipt of the [employer] Vaccine and the availability of other FDA-authorized and -approved COVID-19 vaccines. People who had TTS after a first dose of Janssen vaccine should not receive a subsequent dose of Janssen product    Am a female between ages 41 and 55 years old  no Males 5 through 85 years of age may receive the correct formulation of Pfizer-BioNTech COVID-19 vaccine. Males 18 and older can receive any FDA-authorized or -approved vaccine. However, people receiving an mRNA COVID-19 vaccine,  especially males 10 through 85 years of age and their parents/legal representative (when relevant), should be informed of the risk of developing myocarditis (an inflammation of the heart muscle) or pericarditis (inflammation of the lining around the heart) after receipt of an mRNA vaccine. The risk of developing either  myocarditis or pericarditis after vaccination is low, and lower than the risk of myocarditis associated with SARS-CoV-2 infection in adolescents and adults. Vaccine recipients should be counseled about the need to seek care if symptoms of myocarditis or pericarditis develop after vaccination     Have a history of myocarditis or pericarditis  no Myocarditis or pericarditis after receipt of the first dose of an mRNA COVID-19 vaccine series but before administration of the second dose  Experts advise that people who develop myocarditis or pericarditis after a dose of an mRNA COVID-19 vaccine not receive a subsequent dose of any COVID-19 vaccine, until additional safety data are available.  Administration of a subsequent dose of COVID-19 vaccine before safety data are available can be considered in certain circumstances after the episode of myocarditis or pericarditis has completely resolved. Until additional data are available, some experts recommend a Linwood Dibbles COVID-19 vaccine be considered instead of an mRNA COVID-19 vaccine. Decisions about proceeding with a subsequent dose should include a conversation between the patient, their parent/legal representative (when relevant), and their clinical team, which may include a cardiologist.    Have been treated with monoclonal antibodies or convalescent serum to prevent or treat COVID-19  no Vaccination should be offered to people regardless of history of prior symptomatic or asymptomatic SARS-CoV-2 infection. There is no recommended minimal interval between infection and vaccination.  However, vaccination should be deferred if a patient received monoclonal antibodies or convalescent serum as treatment for COVID-19 or for post-exposure prophylaxis. This is a precautionary measure until additional information becomes available, to avoid interference of the antibody treatment with vaccine-induced immune responses.  Defer COVID-19 vaccination for 30 days when a  passive antibody product was used for post-exposure prophylaxis.  Defer COVID-19 vaccination for 90 days when a passive antibody product was used to treat COVID-19.     Diagnosed with Multisystem Inflammatory Syndrome (MIS-C or MIS-A) after a COVID-19 infection  no It is unknown if people with a history of MIS-C or MIS-A are at risk for a dysregulated immune response to COVID-19 vaccination.  People with a history of MIS-C or MIS-A may choose to be vaccinated. Considerations for vaccination may include:   Clinical recovery from MIS-C or MIS-A, including return to normal cardiac function   Personal risk of severe acute COVID-19 (e.g., age, underlying conditions)   High or substantial community transmission of SARS-CoV-2 and personal increased risk of reinfection.   Timing of any immunomodulatory therapies (general best practice guidelines for immunization can be consulted for more information [employer])   It has been 90 days or more since their diagnosis of MIS-C   Onset of MIS-C occurred before any COVID-19 vaccination   A conversation between the patient, their guardian(s), and their clinical team or a specialist may assist with COVID-19 vaccination decisions. Healthcare providers and health departments may also request a consultation from the Clinical Immunization Safety Assessment Project at [employer] [email or web address]/cisa/index.html.     Have a bleeding disorder  no Take a blood thinner  no As with all vaccines, any COVID-19 vaccine product may be given to these patients, if a physician familiar with the patient's bleeding risk determines that the vaccine can be administered intramuscularly with reasonable safety.  ACIP recommends the following technique for intramuscular vaccination in patients with bleeding disorders or taking blood thinners: a fine-gauge needle (23-gauge or smaller caliber) should be used  for the vaccination, followed by firm pressure on the site, without rubbing, for at least 2 minutes.  People who regularly take aspirin or anticoagulants as part of their routine medications do not need to stop these medications prior to receipt of any COVID-19 vaccine.    Have a history of heparin-induced thrombocytopenia (HIT)  no Although the etiology of TTS associated with the Alphonsa Overall COVID-19 vaccine is unclear, it appears to be similar to another rare immune-mediated syndrome, heparin-induced thrombocytopenia (HIT). People with a history of an episode of an immune-mediated syndrome characterized by thrombosis and thrombocytopenia, such as HIT, should be offered a currently FDA-approved or FDA-authorized mRNA COVID-19 vaccine if it has been ?90 days since their TTS resolved. After 90 days, patients may be vaccinated with any currently FDA-approved or FDA-authorized COVID-19 vaccine, including Janssen COVID-19 Vaccine. However, people who developed TTS after their initial Alphonsa Overall vaccine should not receive a Janssen booster dose.  Experts believe the following factors do not make people more susceptible to TTS after receipt of the [employer]. People with these conditions can be vaccinated with any FDA-authorized or - approved COVID-19 vaccine, including the [employer] COVID-19 Vaccine:   A prior history of venous thromboembolism   Risk factors for venous thromboembolism (e.g., inherited or acquired thrombophilia including Factor V Leiden; prothrombin gene 20210A mutation; antiphospholipid syndrome; protein C, protein S or antithrombin deficiency   A prior history of other types of thromboses not associated with thrombocytopenia   Pregnancy, post-partum status, or receipt of hormonal contraceptives (e.g., combined oral contraceptives, patch, ring)   Additional recipient education materials can be found at http://gutierrez-robinson.com/ vaccines/safety/JJUpdate.html.    Am currently  pregnant or breastfeeding  no Vaccination is recommended for all people aged 66 years and older, including people that are:   Pregnant   Breastfeeding   Trying to get pregnant now or who might become pregnant in the future   Pregnant, breastfeeding, and post-partum people 72 through 85 years of age should be aware of the rare risk of TTS after receipt of the Alphonsa Overall COVID-19 Vaccine and the availability of other FDA-authorized or -approved COVID-19 vaccines (i.e., mRNA vaccines).    Have received dermal fillers  no FDA-authorized or -approved COVID-19 vaccines can be administered to people who have received injectable dermal fillers who have no contraindications for vaccination.  Infrequently, these people might experience temporary swelling at or near the site of filler injection (usually the face or lips) following administration of a dose of an mRNA COVID-19 vaccine. These people should be advised to contact their healthcare provider if swelling develops at or near the site of dermal filler following vaccination.     Have a history of Guillain-Barr Syndrome (GBS)  no People with a history of GBS can receive any FDA-authorized or -approved COVID-19 vaccine. However, given the possible association between the [employer] and an increased risk of GBS, a patient with a history of GBS and their clinical team should discuss the availability of mRNA vaccines to offer protection against COVID-19. The highest risk has been observed in men aged 19-64 years with symptoms of GBS beginning within 42 days after Alphonsa Overall COVID-19 vaccination.  People who had GBS after receiving Janssen vaccine should be made aware of the option to receive an mRNA COVID-19 vaccine booster at least 2 months (8 weeks) after  the Janssen dose. However, Linwood Dibbles vaccine may be used as a booster, particularly if GBS occurred more than 42 days after vaccination or was related to a non-vaccine factor. Prior to booster  vaccination, a conversation between the patient and their clinical team may assist with decisions about use of a COVID-19 booster dose, including the timing of administration     Postvaccination Observation Times for People without Contraindications to Covid 19 Vaccination.  30 minutes:  People with a history of: A contraindication to another type of COVID-19 vaccine product (i.e., mRNA or viral vector COVID-19 vaccines)   Immediate (within 4 hours of exposure) non-severe allergic reaction to a COVID-19 vaccine or injectable therapies   Anaphylaxis due to any cause   Immediate allergic reaction of any severity to a non-COVID-19 vaccine   15 minutes: All other people  This patient is a 87 y.o. female that meets the FDA criteria to receive homebound vaccination. Patient or parent/caregiver understands they have the option to accept or refuse homebound vaccination.  Patient passed the pre-screening checklist and would like to proceed with homebound vaccination.  Based on questionnaire above, I recommend the patient be observed for 15 minutes.  There are an estimated #0 other household members/caregivers who are also interested in receiving the vaccine.    The patient has been confirmed homebound and eligible for homebound vaccination with the considerations outlined above. I will send the patient's information to our scheduling team who will reach out to schedule the patient and potential caregiver/family members for homebound vaccination.    Skip Mayer 08/06/2020 1:29 PM

## 2020-09-08 ENCOUNTER — Ambulatory Visit: Payer: Medicare (Managed Care) | Attending: Critical Care Medicine

## 2020-09-08 DIAGNOSIS — Z23 Encounter for immunization: Secondary | ICD-10-CM

## 2020-09-08 NOTE — Progress Notes (Signed)
   Covid-19 Vaccination Clinic  Name:  Aleya Durnell    MRN: [id number] DOB: July 24, 1921  09/08/2020  Ms. Day was observed post Covid-19 immunization for 15 min without incident. She was provided with Vaccine Information Sheet and instruction to access the V-Safe system.   Ms. Bakke was instructed to call 911 with any severe reactions post vaccine: Marland Kitchen Difficulty breathing  . Swelling of face and throat  . A fast heartbeat  . A bad rash all over body  . Dizziness and weakness   Immunizations Administered    Name Date Dose VIS Date Route   PFIZER Comrnaty(Gray TOP) Covid-19 Vaccine 09/08/2020 11:55 AM 0.3 mL 06/04/2020 Intramuscular   Manufacturer: [employer], [employer]   Lot: [id number]   NDC: [phone]

## 2020-11-13 ENCOUNTER — Emergency Department (HOSPITAL_COMMUNITY)
Admission: EM | Admit: 2020-11-13 | Discharge: 2020-11-13 | Disposition: A | Discharge status: Home / Self Care | Payer: Medicare (Managed Care) | Attending: Emergency Medicine | Admitting: Emergency Medicine

## 2020-11-13 ENCOUNTER — Other Ambulatory Visit: Payer: Self-pay

## 2020-11-13 ENCOUNTER — Emergency Department (HOSPITAL_COMMUNITY): Payer: Medicare (Managed Care)

## 2020-11-13 DIAGNOSIS — R7309 Other abnormal glucose: Secondary | ICD-10-CM | POA: Diagnosis not present

## 2020-11-13 DIAGNOSIS — S40012A Contusion of left shoulder, initial encounter: Secondary | ICD-10-CM

## 2020-11-13 DIAGNOSIS — F0390 Unspecified dementia without behavioral disturbance: Secondary | ICD-10-CM | POA: Insufficient documentation

## 2020-11-13 DIAGNOSIS — W19XXXA Unspecified fall, initial encounter: Secondary | ICD-10-CM

## 2020-11-13 DIAGNOSIS — I10 Essential (primary) hypertension: Secondary | ICD-10-CM | POA: Insufficient documentation

## 2020-11-13 DIAGNOSIS — W1811XA Fall from or off toilet without subsequent striking against object, initial encounter: Secondary | ICD-10-CM | POA: Insufficient documentation

## 2020-11-13 DIAGNOSIS — S0000XA Unspecified superficial injury of scalp, initial encounter: Secondary | ICD-10-CM | POA: Insufficient documentation

## 2020-11-13 DIAGNOSIS — S0093XA Contusion of unspecified part of head, initial encounter: Secondary | ICD-10-CM

## 2020-11-13 LAB — CBG MONITORING, ED: Glucose-Capillary: 166 mg/dL — ABNORMAL HIGH (ref 70–99)

## 2020-11-13 NOTE — ED Triage Notes (Signed)
Pt came from home via EMS. Daughter takes care of her and is HCPOA. Per EMS, pt was on the commode today, daughter was in bathroom with her, daughter turned to do something and when she turned back around. She saw pt lying on her left side against the wall of the bathtub. Per daughter, pt appears to have listed to the side off of the toilet. PMH of Alzheimer's. Pt is not on blood thinners. Pt denies any complaints. Daughter requests x-rays of left jaw, shoulder, and hip

## 2020-11-13 NOTE — Discharge Instructions (Addendum)
It was our pleasure to provide your ER care today - we hope that you feel better.  Fall precautions.   Take acetaminophen as need.   Return to ER if worse, new symptoms, new/severe pain, fevers, trouble breathing, or other emergency concern.

## 2020-11-13 NOTE — ED Provider Notes (Signed)
[facility]-EMERGENCY DEPT Provider Note   CSN: [id number] Arrival date & time: 11/13/20  1834     History Chief Complaint  Patient presents with  . Fall    Angela Coleman is a 87 y.o. female.  Patient s/p fall from toilet onto left side. Patient with advanced dementia, unable to give history, level 5 caveat. Pts family member was assisting her, and her turned away for a moment when patient fell to side.  No loc noted. Pts mental status reported as c/w baseline since fall. No nausea/vomiting noted. Pt denies pain. No anticoag use.   The history is provided by the patient and the EMS personnel. The history is limited by the condition of the patient.  Fall       Past Medical History:  Diagnosis Date  . Bell's palsy   . Disorder of bone and cartilage, unspecified   . Diverticulosis of colon (without mention of hemorrhage)   . Generalized osteoarthrosis, unspecified site   . Paralytic strabismus, sixth or abducens nerve palsy   . Pure hypercholesterolemia   . Sebaceous cyst   . Unspecified essential hypertension   . Unspecified hearing loss   . Unspecified venous (peripheral) insufficiency   . Unspecified vitamin D deficiency     Patient Active Problem List   Diagnosis Date Noted  . GI bleed 06/17/2016  . Hematemesis   . Hiatal hernia   . VITILIGO 09/13/2009  . VITAMIN D DEFICIENCY 01/18/2009  . BELLS PALSY 01/18/2008  . PARALYTIC STRABISMUS SIXTH/ABDUCENS NERVE PALSY 01/18/2008  . HEARING LOSS 01/18/2008  . Diverticulosis of colon 01/18/2008  . OSTEOPENIA 01/18/2008  . HYPERCHOLESTEROLEMIA 09/03/2007  . SEBACEOUS CYST 09/03/2007  . VENOUS INSUFFICIENCY 05/28/2007  . Essential hypertension 04/17/2007  . DEGENERATIVE JOINT DISEASE, GENERALIZED 04/17/2007    Past Surgical History:  Procedure Laterality Date  . CATARACT EXTRACTION    . ESOPHAGOGASTRODUODENOSCOPY N/A 06/17/2016   Procedure: ESOPHAGOGASTRODUODENOSCOPY (EGD);  Surgeon: Rachael Fee, MD;  Location: [facility] ENDOSCOPY;  Service: Endoscopy;  Laterality: N/A;  . VESICOVAGINAL FISTULA CLOSURE W/ TAH  1974     OB History   No obstetric history on file.     Family History  Problem Relation Age of Onset  . Diabetes Father     Social History   Tobacco Use  . Smoking status: Never Smoker  . Smokeless tobacco: Never Used  Substance Use Topics  . Alcohol use: No  . Drug use: No    Home Medications Prior to Admission medications   Medication Sig Start Date End Date Taking? Authorizing Provider  acetaminophen (TYLENOL) 325 MG tablet Take 650 mg by mouth 3 (three) times daily as needed for headache (pain).    [provider]  antiseptic oral rinse (BIOTENE) LIQD 5 mLs by Mouth Rinse route at bedtime.    [provider]  Calcium Carbonate Antacid (CALCIUM CARBONATE, DOSED IN MG ELEMENTAL CALCIUM,) 1250 MG/5ML SUSP Take 500 mg of elemental calcium by mouth daily with breakfast.    [provider]  Cholecalciferol (VITAMIN D) 1000 UNITS capsule Take 1,000 Units by mouth daily.      [provider]  ferrous sulfate (FERROUSUL) 325 (65 FE) MG tablet Take 1 tablet (325 mg total) by mouth daily with breakfast. 06/18/16   Arnetha Courser, MD  Glucosamine-Chondroitin 1500-1200 MG/30ML LIQD Take 30 mLs by mouth daily. 05/02/16   [provider]  isosorbide mononitrate (IMDUR) 30 MG 24 hr tablet Take 30 mg by mouth daily.  [provider]  OVER THE COUNTER MEDICATION Place 1 drop into both eyes daily as needed (dry eyes). Bausch and Lomb advanced eye relief - OTC    [provider]  pantoprazole (PROTONIX) 40 MG tablet Take 1 tablet (40 mg total) by mouth daily. 08/12/16   Rachael Fee, MD  sennosides-docusate sodium (SENOKOT-S) 8.6-50 MG tablet Take 1 tablet by mouth at bedtime.    [provider]    Allergies    Patient has no known allergies.  Review of Systems   Review of Systems  Unable to perform  ROS: Dementia  level 5 caveat - dementia   Physical Exam Updated Vital Signs BP (!) 150/97 (BP Location: Right Arm)   Pulse 61   Temp 97.8 F (36.6 C) (Oral)   Resp 15   Ht 1.549 m (5\' 1" )   Wt 66.7 kg   SpO2 100%   BMI 27.78 kg/m   Physical Exam Vitals and nursing note reviewed.  Constitutional:      Appearance: Normal appearance. She is well-developed.  HENT:     Head:     Comments: Tenderness scalp.     Nose: Nose normal.     Mouth/Throat:     Mouth: Mucous membranes are moist.  Eyes:     General: No scleral icterus.    Conjunctiva/sclera: Conjunctivae normal.     Pupils: Pupils are equal, round, and reactive to light.  Neck:     Vascular: No carotid bruit.     Trachea: No tracheal deviation.  Cardiovascular:     Rate and Rhythm: Normal rate and regular rhythm.     Pulses: Normal pulses.     Heart sounds: Normal heart sounds. No murmur heard. No friction rub. No gallop.   Pulmonary:     Effort: Pulmonary effort is normal. No respiratory distress.     Breath sounds: Normal breath sounds.  Chest:     Chest wall: No tenderness.  Abdominal:     General: Bowel sounds are normal. There is no distension.     Palpations: Abdomen is soft.     Tenderness: There is no abdominal tenderness.     Comments: No abd contusion or tenderness.   Genitourinary:    Comments: No cva tenderness.  Musculoskeletal:        General: No swelling.     Cervical back: Normal range of motion and neck supple. No rigidity. No muscular tenderness.     Comments: CTLS spine, non tender, aligned, no step off. Tenderness left shoulder and left hip. No deformity noted. Distal pulses palp bil.   Skin:    General: Skin is warm and dry.     Findings: No rash.  Neurological:     Mental Status: She is alert.     Comments: Alert, content. Motor intact bil, stre 5/5. Sens grossly intact.   Psychiatric:        Mood and Affect: Mood normal.     ED Results / Procedures / Treatments   Labs (all  labs ordered are listed, but only abnormal results are displayed) Labs Reviewed  CBG MONITORING, ED - Abnormal; Notable for the following components:      Result Value   Glucose-Capillary 166 (*)    All other components within normal limits    EKG None  Radiology CT HEAD WO CONTRAST  Result Date: 11/13/2020 CLINICAL DATA:  87 year old post head trauma, fall off commode. EXAM: CT HEAD WITHOUT CONTRAST TECHNIQUE: Contiguous axial images were obtained  from the base of the skull through the vertex without intravenous contrast. COMPARISON:  None. FINDINGS: Brain: No evidence of acute infarction, hemorrhage, extra-axial collection or mass lesion/mass effect. Moderate atrophy. Advanced periventricular and deep chronic small vessel ischemia. Mild chronic ischemic changes in the basal ganglia. Vascular: Atherosclerosis of skullbase vasculature without hyperdense vessel or abnormal calcification. Skull: No fracture or focal lesion. Sinuses/Orbits: Paranasal sinuses and mastoid air cells are clear. The visualized orbits are unremarkable. Bilateral cataract resection. Other: None. IMPRESSION: 1. No acute intracranial abnormality. No skull fracture. 2. Moderate atrophy.  Advanced chronic small vessel ischemia. Electronically Signed   By: Narda RutherfordMelanie  Sanford M.D.   On: 11/13/2020 19:48   CT CERVICAL SPINE WO CONTRAST  Result Date: 11/13/2020 CLINICAL DATA:  87 year old post fall off commode. EXAM: CT CERVICAL SPINE WITHOUT CONTRAST TECHNIQUE: Multidetector CT imaging of the cervical spine was performed without intravenous contrast. Multiplanar CT image reconstructions were also generated. COMPARISON:  None. FINDINGS: Alignment: Exaggerated cervical lordosis. No traumatic subluxation. Posterior elements are normally aligned. Skull base and vertebrae: No acute fracture. Vertebral body heights are maintained. The dens and skull base are intact. Soft tissues and spinal canal: No prevertebral fluid or swelling. No  visible canal hematoma. Disc levels: Multilevel degenerative disc disease most prominently affecting C5-C6. There is multilevel facet hypertrophy. Degenerative facet ankylosis C4-C5 on the right. Upper chest: No acute or unexpected findings. Other: Mild carotid calcifications. IMPRESSION: 1. No acute fracture or subluxation of the cervical spine. 2. Multilevel degenerative disc disease and facet hypertrophy. Electronically Signed   By: Narda RutherfordMelanie  Sanford M.D.   On: 11/13/2020 19:45   DG Shoulder Left  Result Date: 11/13/2020 CLINICAL DATA:  Left shoulder pain after fall.  Fall off commode. EXAM: LEFT SHOULDER - 2+ VIEW COMPARISON:  None. FINDINGS: There is no evidence of fracture or dislocation. Minor acromioclavicular and glenohumeral osteoarthritis. Soft tissues are unremarkable. IMPRESSION: No fracture or subluxation of the left shoulder. Electronically Signed   By: Narda RutherfordMelanie  Sanford M.D.   On: 11/13/2020 19:55   DG HIP UNILAT W OR W/O PELVIS 2-3 VIEWS LEFT  Result Date: 11/13/2020 CLINICAL DATA:  Left hip pain after fall.  Fall of commode. EXAM: DG HIP (WITH OR WITHOUT PELVIS) 2-3V LEFT COMPARISON:  Radiograph 10/08/2018 FINDINGS: The bones are under mineralized. Left hip joint space is preserved. The femoral head is well seated. No evidence of fracture. Bony pelvis and pubic rami are intact. Sacroiliac joints and pubic symphysis are congruent. Mild enthesopathic change about the greater trochanter. Stool ball distends the rectum. IMPRESSION: 1. No fracture of the pelvis or left hip. 2. Osteopenia/osteoporosis. Electronically Signed   By: Narda RutherfordMelanie  Sanford M.D.   On: 11/13/2020 19:56    Procedures Procedures   Medications Ordered in ED Medications - No data to display  ED Course  I have reviewed the triage vital signs and the nursing notes.  Pertinent labs & imaging results that were available during my care of the patient were reviewed by me and considered in my medical decision making (see  chart for details).    MDM Rules/Calculators/A&P                          Imaging studies ordered.   Reviewed nursing notes and prior charts for additional history.   Xrays reviewed/interpreted by me - no fx.  Labs reviewed/interpreted by me - glucose mildly high.   CT reviewed/interpreted by me - no hem.   Acetaminophen po.  Pt appears comfortably, nad, and stable for d/c.   Rec pcp f/u. Fall precautions.      Final Clinical Impression(s) / ED Diagnoses Final diagnoses:  None    Rx / DC Orders ED Discharge Orders    None       Cathren Laine, MD 11/13/20 2003

## 2020-11-22 ENCOUNTER — Ambulatory Visit (HOSPITAL_COMMUNITY)
Admission: EM | Admit: 2020-11-22 | Discharge: 2020-11-22 | Disposition: A | Discharge status: Home / Self Care | Payer: Medicare (Managed Care) | Attending: Medical Oncology | Admitting: Medical Oncology

## 2020-11-22 ENCOUNTER — Encounter (HOSPITAL_COMMUNITY): Payer: Self-pay

## 2020-11-22 ENCOUNTER — Other Ambulatory Visit: Payer: Self-pay

## 2020-11-22 DIAGNOSIS — Z789 Other specified health status: Secondary | ICD-10-CM

## 2020-11-22 DIAGNOSIS — R531 Weakness: Secondary | ICD-10-CM | POA: Diagnosis not present

## 2020-11-22 DIAGNOSIS — R8290 Unspecified abnormal findings in urine: Secondary | ICD-10-CM

## 2020-11-22 LAB — POCT URINALYSIS DIPSTICK, ED / UC
Bilirubin Urine: NEGATIVE
Glucose, UA: NEGATIVE mg/dL
Ketones, ur: NEGATIVE mg/dL
Nitrite: NEGATIVE
Protein, ur: NEGATIVE mg/dL
Specific Gravity, Urine: 1.015 (ref 1.005–1.030)
Urobilinogen, UA: 0.2 mg/dL (ref 0.0–1.0)
pH: 8.5 — ABNORMAL HIGH (ref 5.0–8.0)

## 2020-11-22 LAB — CBG MONITORING, ED: Glucose-Capillary: 94 mg/dL (ref 70–99)

## 2020-11-22 MED ORDER — FOSFOMYCIN TROMETHAMINE 3 G PO PACK: 3.0000 g | PACK | Freq: Once | ORAL | 0 refills | Status: AC

## 2020-11-22 NOTE — ED Triage Notes (Signed)
Per daughter pt has being telling her she is feeling weak and not feeling good x 5 days. Denies fever,   Daughter wants to know if the pt can check for UTI, as she use diapers.   Per daughter pt fell on the bathroom when sitting in the toilet as she felt weak, 1 week ago. Pt was seen at the ED.

## 2020-11-22 NOTE — ED Provider Notes (Signed)
[facility]    CSN: [id number] Arrival date & time: 11/22/20  1426      History   Chief Complaint Chief Complaint  Patient presents with  . Weakness    HPI Angela Coleman is a 87 y.o. female.   HPI   Weakness: Patient presents with her daughter who helps give most of the history due to patient's dementia.  According to patient's daughter patient has been feeling weak and has had urinary frequency for the past 5 days.  About 1 week ago she did fall while trying to use the toilet and was suspected to have bumped her head.  She was seen in the ED and did have negative scans according to patient's daughter.  Patient's daughter is more concerned that patient is having a urinary tract infection given the urinary frequency and feeling a bit more tired in the morning.  She denies any worsening of symptoms over the past 5 days, no vomiting, headache, visual changes, fever, or abdominal pain or new weakness.  Tylenol has been helpful for symptoms.  Of note she is not on a blood thinner.  Past Medical History:  Diagnosis Date  . Bell's palsy   . Disorder of bone and cartilage, unspecified   . Diverticulosis of colon (without mention of hemorrhage)   . Generalized osteoarthrosis, unspecified site   . Paralytic strabismus, sixth or abducens nerve palsy   . Pure hypercholesterolemia   . Sebaceous cyst   . Unspecified essential hypertension   . Unspecified hearing loss   . Unspecified venous (peripheral) insufficiency   . Unspecified vitamin D deficiency     Patient Active Problem List   Diagnosis Date Noted  . GI bleed 06/17/2016  . Hematemesis   . Hiatal hernia   . VITILIGO 09/13/2009  . VITAMIN D DEFICIENCY 01/18/2009  . BELLS PALSY 01/18/2008  . PARALYTIC STRABISMUS SIXTH/ABDUCENS NERVE PALSY 01/18/2008  . HEARING LOSS 01/18/2008  . Diverticulosis of colon 01/18/2008  . OSTEOPENIA 01/18/2008  . HYPERCHOLESTEROLEMIA 09/03/2007  . SEBACEOUS CYST 09/03/2007  . VENOUS  INSUFFICIENCY 05/28/2007  . Essential hypertension 04/17/2007  . DEGENERATIVE JOINT DISEASE, GENERALIZED 04/17/2007    Past Surgical History:  Procedure Laterality Date  . CATARACT EXTRACTION    . ESOPHAGOGASTRODUODENOSCOPY N/A 06/17/2016   Procedure: ESOPHAGOGASTRODUODENOSCOPY (EGD);  Surgeon: Rachael Fee, MD;  Location: [facility] ENDOSCOPY;  Service: Endoscopy;  Laterality: N/A;  . VESICOVAGINAL FISTULA CLOSURE W/ TAH  1974    OB History   No obstetric history on file.      Home Medications    Prior to Admission medications   Medication Sig Start Date End Date Taking? Authorizing Provider  acetaminophen (TYLENOL) 325 MG tablet Take 650 mg by mouth 3 (three) times daily as needed for headache (pain).    [provider]  antiseptic oral rinse (BIOTENE) LIQD 5 mLs by Mouth Rinse route at bedtime.    [provider]  Calcium Carbonate Antacid (CALCIUM CARBONATE, DOSED IN MG ELEMENTAL CALCIUM,) 1250 MG/5ML SUSP Take 500 mg of elemental calcium by mouth daily with breakfast.    [provider]  Cholecalciferol (VITAMIN D) 1000 UNITS capsule Take 1,000 Units by mouth daily.    [provider]  ferrous sulfate (FERROUSUL) 325 (65 FE) MG tablet Take 1 tablet (325 mg total) by mouth daily with breakfast. 06/18/16   Arnetha Courser, MD  Glucosamine-Chondroitin 1500-1200 MG/30ML LIQD Take 30 mLs by mouth daily. 05/02/16   [provider]  isosorbide mononitrate (IMDUR) 30  MG 24 hr tablet Take 30 mg by mouth daily.    [provider]  OVER THE COUNTER MEDICATION Place 1 drop into both eyes daily as needed (dry eyes). Bausch and Lomb advanced eye relief - OTC    [provider]  pantoprazole (PROTONIX) 40 MG tablet Take 1 tablet (40 mg total) by mouth daily. 08/12/16   Rachael Fee, MD  sennosides-docusate sodium (SENOKOT-S) 8.6-50 MG tablet Take 1 tablet by mouth at bedtime.    [provider]    Family History Family  History  Problem Relation Age of Onset  . Diabetes Father     Social History Social History   Tobacco Use  . Smoking status: Never Smoker  . Smokeless tobacco: Never Used  Substance Use Topics  . Alcohol use: No  . Drug use: No     Allergies   Patient has no known allergies.   Review of Systems Review of Systems  As stated above in HPI Physical Exam Triage Vital Signs ED Triage Vitals [11/22/20 1439]  Enc Vitals Group     BP 139/89     Pulse Rate 65     Resp 17     Temp 98.9 F (37.2 C)     Temp Source Oral     SpO2 100 %     Weight      Height      Head Circumference      Peak Flow      Pain Score      Pain Loc      Pain Edu?      Excl. in GC?    No data found.  Updated Vital Signs BP 139/89 (BP Location: Right Arm)   Pulse 65   Temp 98.9 F (37.2 C) (Oral)   Resp 17   SpO2 100%   Physical Exam Vitals and nursing note reviewed.  Constitutional:      General: She is not in acute distress.    Appearance: Normal appearance. She is not ill-appearing, toxic-appearing or diaphoretic.  HENT:     Head: Normocephalic and atraumatic.     Right Ear: Tympanic membrane normal. There is no impacted cerumen.     Left Ear: Tympanic membrane normal. There is no impacted cerumen.     Nose: Nose normal.     Mouth/Throat:     Mouth: Mucous membranes are moist.  Eyes:     General: No scleral icterus.    Extraocular Movements: Extraocular movements intact.     Conjunctiva/sclera: Conjunctivae normal.     Pupils: Pupils are equal, round, and reactive to light.  Cardiovascular:     Rate and Rhythm: Normal rate and regular rhythm.     Heart sounds: Normal heart sounds.  Pulmonary:     Effort: Pulmonary effort is normal.     Breath sounds: Normal breath sounds.  Abdominal:     General: Abdomen is flat. Bowel sounds are normal. There is no distension.     Palpations: Abdomen is soft. There is no mass.     Tenderness: There is no abdominal tenderness. There is no  right CVA tenderness, left CVA tenderness, guarding or rebound.     Hernia: No hernia is present.  Musculoskeletal:     Cervical back: Normal range of motion and neck supple.  Lymphadenopathy:     Cervical: No cervical adenopathy.  Skin:    General: Skin is warm.  Neurological:     Mental Status: She is alert.  Cranial Nerves: No cranial nerve deficit (no new weakness).     Motor: No weakness.     Coordination: Coordination normal.     Deep Tendon Reflexes: Reflexes normal.      UC Treatments / Results  Labs (all labs ordered are listed, but only abnormal results are displayed) Labs Reviewed  POCT URINALYSIS DIPSTICK, ED / UC - Abnormal; Notable for the following components:      Result Value   Hgb urine dipstick TRACE (*)    pH 8.5 (*)    Leukocytes,Ua LARGE (*)    All other components within normal limits  URINE CULTURE  CBG MONITORING, ED    EKG   Radiology No results found.  Procedures Procedures (including critical care time)  Medications Ordered in UC Medications - No data to display  Initial Impression / Assessment and Plan / UC Course  I have reviewed the triage vital signs and the nursing notes.  Pertinent labs & imaging results that were available during my care of the patient were reviewed by me and considered in my medical decision making (see chart for details).     New.  Complicated case given her recent fall.  I did discuss with patient that mild brain bleeds can occur after the day of the fall and that the only way to rule this out is through CT imaging at the hospital.  Reassuring symptoms are the fact that patient has not worsened over the course of the 5 days and that she is having this new urinary frequency without any headaches or vomiting.  We discussed ER follow-up and work-up today versus extremely close monitoring with exceptionally low threshold for going to the emergency department while we trial fosfomycin given her urinalysis results.   I discussed with patient's daughter that I chose fosfomycin as it has a very low resistance pattern and is a one-time dose medication.  If her symptoms or not improving over the next 24 hours she needs further work-up or should she worsen at any point.  Follow-up in 2 days by PCP Final Clinical Impressions(s) / UC Diagnoses   Final diagnoses:  None   Discharge Instructions   None    ED Prescriptions    None     PDMP not reviewed this encounter.   Rushie Chestnut, [place] 11/22/20 1659

## 2020-11-25 LAB — URINE CULTURE: Culture: >=100000 — AB

## 2021-01-06 ENCOUNTER — Other Ambulatory Visit: Payer: Self-pay

## 2021-01-06 ENCOUNTER — Observation Stay (HOSPITAL_COMMUNITY)
Admission: EM | Admit: 2021-01-06 | Discharge: 2021-01-07 | Disposition: A | Discharge status: Home / Self Care | Payer: Medicare (Managed Care) | Attending: Family Medicine | Admitting: Family Medicine

## 2021-01-06 ENCOUNTER — Emergency Department (HOSPITAL_COMMUNITY): Payer: Medicare (Managed Care)

## 2021-01-06 ENCOUNTER — Encounter (HOSPITAL_COMMUNITY): Payer: Self-pay | Admitting: Family Medicine

## 2021-01-06 DIAGNOSIS — G8929 Other chronic pain: Secondary | ICD-10-CM | POA: Diagnosis present

## 2021-01-06 DIAGNOSIS — I10 Essential (primary) hypertension: Secondary | ICD-10-CM | POA: Insufficient documentation

## 2021-01-06 DIAGNOSIS — Z20822 Contact with and (suspected) exposure to covid-19: Secondary | ICD-10-CM | POA: Diagnosis not present

## 2021-01-06 DIAGNOSIS — R55 Syncope and collapse: Principal | ICD-10-CM | POA: Insufficient documentation

## 2021-01-06 DIAGNOSIS — L89519 Pressure ulcer of right ankle, unspecified stage: Secondary | ICD-10-CM | POA: Diagnosis not present

## 2021-01-06 DIAGNOSIS — I4581 Long QT syndrome: Secondary | ICD-10-CM | POA: Diagnosis not present

## 2021-01-06 DIAGNOSIS — R9431 Abnormal electrocardiogram [ECG] [EKG]: Secondary | ICD-10-CM | POA: Diagnosis present

## 2021-01-06 DIAGNOSIS — F0390 Unspecified dementia without behavioral disturbance: Secondary | ICD-10-CM | POA: Insufficient documentation

## 2021-01-06 DIAGNOSIS — Z79899 Other long term (current) drug therapy: Secondary | ICD-10-CM | POA: Insufficient documentation

## 2021-01-06 DIAGNOSIS — L8990 Pressure ulcer of unspecified site, unspecified stage: Secondary | ICD-10-CM | POA: Diagnosis present

## 2021-01-06 LAB — CBC WITH DIFFERENTIAL/PLATELET
Abs Immature Granulocytes: 0.02 10*3/uL (ref 0.00–0.07)
Basophils Absolute: 0 10*3/uL (ref 0.0–0.1)
Basophils Relative: 1 %
Eosinophils Absolute: 0.1 10*3/uL (ref 0.0–0.5)
Eosinophils Relative: 1 %
HCT: 36.9 % (ref 36.0–46.0)
Hemoglobin: 11.7 g/dL — ABNORMAL LOW (ref 12.0–15.0)
Immature Granulocytes: 0 %
Lymphocytes Relative: 8 %
Lymphs Abs: 0.5 10*3/uL — ABNORMAL LOW (ref 0.7–4.0)
MCH: 27.7 pg (ref 26.0–34.0)
MCHC: 31.7 g/dL (ref 30.0–36.0)
MCV: 87.2 fL (ref 80.0–100.0)
Monocytes Absolute: 0.5 10*3/uL (ref 0.1–1.0)
Monocytes Relative: 8 %
Neutro Abs: 4.8 10*3/uL (ref 1.7–7.7)
Neutrophils Relative %: 82 %
Platelets: 273 10*3/uL (ref 150–400)
RBC: 4.23 MIL/uL (ref 3.87–5.11)
RDW: 15.8 % — ABNORMAL HIGH (ref 11.5–15.5)
WBC: 5.9 10*3/uL (ref 4.0–10.5)
nRBC: 0 % (ref 0.0–0.2)

## 2021-01-06 LAB — I-STAT CHEM 8, ED
BUN: 16 mg/dL (ref 8–23)
Calcium, Ion: 1.17 mmol/L (ref 1.15–1.40)
Chloride: 95 mmol/L — ABNORMAL LOW (ref 98–111)
Creatinine, Ser: 0.8 mg/dL (ref 0.44–1.00)
Glucose, Bld: 117 mg/dL — ABNORMAL HIGH (ref 70–99)
HCT: 41 % (ref 36.0–46.0)
Hemoglobin: 13.9 g/dL (ref 12.0–15.0)
Potassium: 3.9 mmol/L (ref 3.5–5.1)
Sodium: 135 mmol/L (ref 135–145)
TCO2: 29 mmol/L (ref 22–32)

## 2021-01-06 LAB — COMPREHENSIVE METABOLIC PANEL
ALT: 12 U/L (ref 0–44)
AST: 24 U/L (ref 15–41)
Albumin: 3.2 g/dL — ABNORMAL LOW (ref 3.5–5.0)
Alkaline Phosphatase: 69 U/L (ref 38–126)
Anion gap: 10 (ref 5–15)
BUN: 15 mg/dL (ref 8–23)
CO2: 31 mmol/L (ref 22–32)
Calcium: 9.3 mg/dL (ref 8.9–10.3)
Chloride: 95 mmol/L — ABNORMAL LOW (ref 98–111)
Creatinine, Ser: 0.79 mg/dL (ref 0.44–1.00)
GFR, Estimated: >60 mL/min (ref >60)
Glucose, Bld: 123 mg/dL — ABNORMAL HIGH (ref 70–99)
Potassium: 4 mmol/L (ref 3.5–5.1)
Sodium: 136 mmol/L (ref 135–145)
Total Bilirubin: 0.3 mg/dL (ref 0.3–1.2)
Total Protein: 6.7 g/dL (ref 6.5–8.1)

## 2021-01-06 LAB — CBG MONITORING, ED: Glucose-Capillary: 101 mg/dL — ABNORMAL HIGH (ref 70–99)

## 2021-01-06 LAB — MAGNESIUM: Magnesium: 2.2 mg/dL (ref 1.7–2.4)

## 2021-01-06 LAB — TROPONIN I (HIGH SENSITIVITY)
Troponin I (High Sensitivity): 25 ng/L — ABNORMAL HIGH (ref <18)
Troponin I (High Sensitivity): 25 ng/L — ABNORMAL HIGH (ref <18)

## 2021-01-06 MED ORDER — SODIUM CHLORIDE 0.9 % IV SOLN: 250.0000 mL | INTRAVENOUS | Status: DC | PRN

## 2021-01-06 MED ORDER — ENSURE ENLIVE PO LIQD
237.0000 mL | Freq: Two times a day (BID) | ORAL | Status: DC
  Administered 2021-01-07: 237 mL via ORAL
  Filled 2021-01-06: qty 237

## 2021-01-06 MED ORDER — ACETAMINOPHEN 650 MG RE SUPP: 650.0000 mg | Freq: Four times a day (QID) | RECTAL | Status: DC | PRN

## 2021-01-06 MED ORDER — MUSCLE RUB 10-15 % EX CREA
TOPICAL_CREAM | Freq: Two times a day (BID) | CUTANEOUS | Status: DC | PRN
  Filled 2021-01-06: qty 85

## 2021-01-06 MED ORDER — TROLAMINE SALICYLATE 10 % EX CREA: TOPICAL_CREAM | Freq: Two times a day (BID) | CUTANEOUS | Status: DC | PRN

## 2021-01-06 MED ORDER — SENNOSIDES-DOCUSATE SODIUM 8.6-50 MG PO TABS: 1.0000 | ORAL_TABLET | Freq: Every evening | ORAL | Status: DC | PRN

## 2021-01-06 MED ORDER — ACETAMINOPHEN 325 MG PO TABS
650.0000 mg | ORAL_TABLET | Freq: Four times a day (QID) | ORAL | Status: DC | PRN
Start: 2021-01-06 — End: 2021-01-07
  Administered 2021-01-07: 650 mg via ORAL
  Filled 2021-01-06: qty 2

## 2021-01-06 MED ORDER — ENOXAPARIN SODIUM 30 MG/0.3ML IJ SOSY
30.0000 mg | PREFILLED_SYRINGE | INTRAMUSCULAR | Status: DC
  Administered 2021-01-07: 30 mg via SUBCUTANEOUS
  Filled 2021-01-06: qty 0.3

## 2021-01-06 MED ORDER — SODIUM CHLORIDE 0.9% FLUSH
3.0000 mL | Freq: Two times a day (BID) | INTRAVENOUS | Status: DC
  Administered 2021-01-06 – 2021-01-07 (×2): 3 mL via INTRAVENOUS

## 2021-01-06 NOTE — ED Notes (Signed)
Pt is incontinent. Pt had a large bowel movement. RN and NT cleaned pt up.

## 2021-01-06 NOTE — ED Notes (Signed)
RN attempted report x1.  

## 2021-01-06 NOTE — ED Notes (Signed)
Patient transported to CT 

## 2021-01-06 NOTE — ED Notes (Signed)
IV team at bedside 

## 2021-01-06 NOTE — ED Notes (Signed)
PULSE RATE IS 92 AND O2 IS 98

## 2021-01-06 NOTE — ED Triage Notes (Signed)
Pt BIB EMS due to a syncopal episode. Pt was at [employer] [facility] and passed out in bathroom. Pt was unconscious and was put into trendelenburg and HR went from 40-70. Pt is alert but not oriented. Pt has a hx of dementia.

## 2021-01-06 NOTE — ED Provider Notes (Signed)
[facility] EMERGENCY DEPARTMENT Provider Note   CSN: [id number] Arrival date & time: 01/06/21  1648     History Chief Complaint  Patient presents with   Near Syncope    Fredi Hurtado is a 87 y.o. female.  The history is provided by the patient and the EMS personnel.      Peyson Delao is a 87 y.o. female, with a history of dementia, presenting to the ED with apparent syncopal episode.  Per EMS, patient was at a daytime adult care facility and was found unconscious in the bathroom.  She was still unconscious upon EMS arrival.  They noted her pulse rate varied between 40 and 70 bpm.  They initially obtained BP of 81/40.  Her legs were elevated, she regained consciousness, and her blood pressure recovered. Patient denies any complaints. She specifically denies headache, recent illness, fever, chest pain, shortness of breath, abdominal pain, extremity pain, numbness, weakness, or any other complaints.  Past Medical History:  Diagnosis Date   Bell's palsy    Disorder of bone and cartilage, unspecified    Diverticulosis of colon (without mention of hemorrhage)    Generalized osteoarthrosis, unspecified site    Paralytic strabismus, sixth or abducens nerve palsy    Pure hypercholesterolemia    Sebaceous cyst    Unspecified essential hypertension    Unspecified hearing loss    Unspecified venous (peripheral) insufficiency    Unspecified vitamin D deficiency     Patient Active Problem List   Diagnosis Date Noted   GI bleed 06/17/2016   Hematemesis    Hiatal hernia    VITILIGO 09/13/2009   VITAMIN D DEFICIENCY 01/18/2009   BELLS PALSY 01/18/2008   PARALYTIC STRABISMUS SIXTH/ABDUCENS NERVE PALSY 01/18/2008   HEARING LOSS 01/18/2008   Diverticulosis of colon 01/18/2008   OSTEOPENIA 01/18/2008   HYPERCHOLESTEROLEMIA 09/03/2007   SEBACEOUS CYST 09/03/2007   VENOUS INSUFFICIENCY 05/28/2007   Essential hypertension 04/17/2007   DEGENERATIVE JOINT DISEASE,  GENERALIZED 04/17/2007    Past Surgical History:  Procedure Laterality Date   CATARACT EXTRACTION     ESOPHAGOGASTRODUODENOSCOPY N/A 06/17/2016   Procedure: ESOPHAGOGASTRODUODENOSCOPY (EGD);  Surgeon: Rachael Fee, MD;  Location: [facility] ENDOSCOPY;  Service: Endoscopy;  Laterality: N/A;   VESICOVAGINAL FISTULA CLOSURE W/ TAH  1974     OB History   No obstetric history on file.     Family History  Problem Relation Age of Onset   Diabetes Father     Social History   Tobacco Use   Smoking status: Never   Smokeless tobacco: Never  Substance Use Topics   Alcohol use: No   Drug use: No    Home Medications Prior to Admission medications   Medication Sig Start Date End Date Taking? Authorizing Provider  acetaminophen (TYLENOL) 325 MG tablet Take 650 mg by mouth 3 (three) times daily as needed for headache (pain).    [provider]  antiseptic oral rinse (BIOTENE) LIQD 5 mLs by Mouth Rinse route at bedtime.    [provider]  Calcium Carbonate Antacid (CALCIUM CARBONATE, DOSED IN MG ELEMENTAL CALCIUM,) 1250 MG/5ML SUSP Take 500 mg of elemental calcium by mouth daily with breakfast.    [provider]  Cholecalciferol (VITAMIN D) 1000 UNITS capsule Take 1,000 Units by mouth daily.    [provider]  ferrous sulfate (FERROUSUL) 325 (65 FE) MG tablet Take 1 tablet (325 mg total) by mouth daily with breakfast. 06/18/16   Arnetha Courser, MD  Glucosamine-Chondroitin 1500-1200 MG/30ML  LIQD Take 30 mLs by mouth daily. 05/02/16   [provider]  isosorbide mononitrate (IMDUR) 30 MG 24 hr tablet Take 30 mg by mouth daily.    [provider]  OVER THE COUNTER MEDICATION Place 1 drop into both eyes daily as needed (dry eyes). Bausch and Lomb advanced eye relief - OTC    [provider]  pantoprazole (PROTONIX) 40 MG tablet Take 1 tablet (40 mg total) by mouth daily. 08/12/16   Rachael Fee, MD  sennosides-docusate sodium  (SENOKOT-S) 8.6-50 MG tablet Take 1 tablet by mouth at bedtime.    [provider]    Allergies    Patient has no known allergies.  Review of Systems   Review of Systems  Unable to perform ROS: Dementia   Physical Exam Updated Vital Signs BP 139/80   Pulse (!) 88   Temp 97.6 F (36.4 C) (Rectal)   Resp 12   SpO2 (!) 96%   Physical Exam Vitals and nursing note reviewed.  Constitutional:      General: She is not in acute distress.    Appearance: She is well-developed. She is not diaphoretic.  HENT:     Head: Normocephalic and atraumatic.     Mouth/Throat:     Mouth: Mucous membranes are moist.     Pharynx: Oropharynx is clear.  Eyes:     Conjunctiva/sclera: Conjunctivae normal.  Cardiovascular:     Rate and Rhythm: Normal rate and regular rhythm.     Pulses: Normal pulses.          Radial pulses are 2+ on the right side and 2+ on the left side.       Posterior tibial pulses are 2+ on the right side and 2+ on the left side.     Heart sounds: Normal heart sounds.     Comments: Tactile temperature in the extremities appropriate and equal bilaterally. Pulmonary:     Effort: Pulmonary effort is normal. No respiratory distress.     Breath sounds: Normal breath sounds.  Abdominal:     Palpations: Abdomen is soft.     Tenderness: There is no abdominal tenderness. There is no guarding.  Musculoskeletal:     Cervical back: Normal range of motion and neck supple.     Right lower leg: No edema.     Left lower leg: No edema.     Comments: Normal motor function intact in all extremities. No midline spinal tenderness. The patient's extremities were examined, palpated, and the joints were ranged without evidence of tenderness, swelling, deformity, instability, or pain with range of motion of the joints.  Lymphadenopathy:     Cervical: No cervical adenopathy.  Skin:    General: Skin is warm and dry.     Comments: Patient was given an overall skin evaluation due to her  syncopal episode.  No specific abnormalities were noted.  Neurological:     Mental Status: She is alert.     Comments: Patient is oriented to self only. She is able to readily follow commands. Sensation grossly intact to light touch in the extremities.   Grip strengths equal bilaterally.   Strength 5/5 in all extremities.  Coordination intact.  Cranial nerves III-XII grossly intact.  Handles oral secretions without noted difficulty.  No noted phonation or speech deficit. No facial droop.   Psychiatric:        Mood and Affect: Mood and affect normal.        Speech: Speech normal.  Behavior: Behavior normal.    ED Results / Procedures / Treatments   Labs (all labs ordered are listed, but only abnormal results are displayed) Labs Reviewed - No data to display  EKG EKG Interpretation  Date/Time:  Wednesday January 06 2021 17:05:41 EDT Ventricular Rate:  65 PR Interval:  198 QRS Duration: 68 QT Interval:  542 QTC Calculation: 564 R Axis:   151 Text Interpretation: Sinus rhythm Probable lateral infarct, age indeterminate Anterior infarct, old Prolonged QT interval No acute changes No significant change since last tracing Confirmed by Derwood Kaplan ([id number]) on 01/06/2021 7:03:09 PM  Radiology DG Chest Port 1 View  Result Date: 01/06/2021 CLINICAL DATA:  Syncopal episode. EXAM: PORTABLE CHEST 1 VIEW COMPARISON:  11/05/2019 FINDINGS: The cardiac silhouette, mediastinal and hilar contours are stable. Moderate tortuosity and calcification of the thoracic aorta. Chronic basilar scarring changes. No infiltrates or effusions. No pneumothorax. Remote healed rib fractures are noted.  No definite acute fractures. IMPRESSION: Chronic basilar scarring changes but no acute pulmonary findings. Electronically Signed   By: Rudie Meyer M.D.   On: 01/06/2021 18:21    Procedures Procedures   Medications Ordered in ED Medications - No data to display  ED Course  I have reviewed the triage  vital signs and the nursing notes.  Pertinent labs & imaging results that were available during my care of the patient were reviewed by me and considered in my medical decision making (see chart for details).    MDM Rules/Calculators/A&P                          Patient presents for evaluation following an apparent syncopal episode. The majority of the patient's work-up was pending at the end of my shift. EDP, Dr. Rhunette Croft, took over the patient's care.   Final Clinical Impression(s) / ED Diagnoses Final diagnoses:  None    Rx / DC Orders ED Discharge Orders     None        Concepcion Living 01/06/21 1943    Derwood Kaplan, MD 01/06/21 2253

## 2021-01-06 NOTE — H&P (Signed)
History and Physical    Angela Coleman WYO:[id number] DOB: 11/19/1921 DOA: 01/06/2021  PCP: Jethro Bastos, MD   Patient coming from: Home   Chief Complaint: Syncope   HPI: Angela Coleman is a 87 y.o. female with medical history significant for Bell's palsy, glaucoma, dementia, chronic pain, and history of GI bleeding, now presenting to the emergency department for evaluation of syncope.  Patient is accompanied by her daughter who assists with the history.  Patient lives with her daughter and attends an adult daycare where she was reportedly found to be unconscious while using the bathroom.  She was said to have heart rate as low as the 40s and initial blood pressure of 81/40.  Patient's legs were lifted and she regained awareness.  She often complains of right knee pain and other aches and pains but had not been complaining of any that in the past few days.  She does not have any complaints in the emergency department.  She has been living with her daughter for the past 7 weeks, requires assistance with transfers between wheelchair, commode, and bed, and has also been requiring assistance with feeding.  She was treated for UTI approximately 2 months ago, acetaminophen dose was recently increased, and she has not been taking melatonin recently but no other medication changes.  ED Course: Upon arrival to the ED, patient is found to be afebrile, saturating well on room air, and with stable blood pressure.  EKG features sinus rhythm with QTc 564 ms.  Chest x-ray with no acute findings.  No acute intracranial abnormality or fracture on CT head and cervical spine.  Chemistry panel and CBC unremarkable.  High-sensitivity troponin slightly elevated. Hospitalists asked to admit.   Review of Systems:  All other systems reviewed and apart from HPI, are negative.  Past Medical History:  Diagnosis Date   Bell's palsy    Disorder of bone and cartilage, unspecified    Diverticulosis of colon (without mention  of hemorrhage)    Generalized osteoarthrosis, unspecified site    Paralytic strabismus, sixth or abducens nerve palsy    Pure hypercholesterolemia    Sebaceous cyst    Unspecified essential hypertension    Unspecified hearing loss    Unspecified venous (peripheral) insufficiency    Unspecified vitamin D deficiency     Past Surgical History:  Procedure Laterality Date   CATARACT EXTRACTION     ESOPHAGOGASTRODUODENOSCOPY N/A 06/17/2016   Procedure: ESOPHAGOGASTRODUODENOSCOPY (EGD);  Surgeon: Rachael Fee, MD;  Location: [facility] ENDOSCOPY;  Service: Endoscopy;  Laterality: N/A;   VESICOVAGINAL FISTULA CLOSURE W/ TAH  1974    Social History:   reports that she has never smoked. She has never used smokeless tobacco. She reports that she does not drink alcohol and does not use drugs.  No Known Allergies  Family History  Problem Relation Age of Onset   Diabetes Father      Prior to Admission medications   Medication Sig Start Date End Date Taking? Authorizing Provider  acetaminophen (TYLENOL) 325 MG tablet Take 650 mg by mouth 3 (three) times daily as needed for headache (pain).    [provider]  antiseptic oral rinse (BIOTENE) LIQD 5 mLs by Mouth Rinse route at bedtime.    [provider]  Calcium Carbonate Antacid (CALCIUM CARBONATE, DOSED IN MG ELEMENTAL CALCIUM,) 1250 MG/5ML SUSP Take 500 mg of elemental calcium by mouth daily with breakfast.    [provider]  Cholecalciferol (VITAMIN D) 1000 UNITS capsule Take 1,000 Units  by mouth daily.    [provider]  ferrous sulfate (FERROUSUL) 325 (65 FE) MG tablet Take 1 tablet (325 mg total) by mouth daily with breakfast. 06/18/16   Arnetha Courser, MD  Glucosamine-Chondroitin 1500-1200 MG/30ML LIQD Take 30 mLs by mouth daily. 05/02/16   [provider]  isosorbide mononitrate (IMDUR) 30 MG 24 hr tablet Take 30 mg by mouth daily.    [provider]  OVER THE COUNTER MEDICATION Place 1  drop into both eyes daily as needed (dry eyes). Bausch and Lomb advanced eye relief - OTC    [provider]  pantoprazole (PROTONIX) 40 MG tablet Take 1 tablet (40 mg total) by mouth daily. 08/12/16   Rachael Fee, MD  sennosides-docusate sodium (SENOKOT-S) 8.6-50 MG tablet Take 1 tablet by mouth at bedtime.    [provider]    Physical Exam: Vitals:   01/06/21 1715 01/06/21 1905 01/06/21 1909 01/06/21 2013  BP: 100/75 139/80  (!) 161/84  Pulse:  (!) 38  63  Resp: 16 12  (!) 8  Temp:   97.6 F (36.4 C)   TempSrc:   Rectal   SpO2:  (!) 49%  100%    Constitutional: NAD, calm  Eyes: PERTLA, lids and conjunctivae normal ENMT: Mucous membranes are moist. Posterior pharynx clear of any exudate or lesions.   Neck: supple, no masses  Respiratory:  no wheezing, no crackles. No accessory muscle use.  Cardiovascular: S1 & S2 heard, regular rate and rhythm. No extremity edema.  Abdomen: No distension, no tenderness, soft. Bowel sounds active.  Musculoskeletal: no clubbing / cyanosis. No joint deformity upper and lower extremities.   Skin: wound over right lateral malleolus. Warm, dry, well-perfused. Neurologic: no gross facial  Sensation intact. Moving all extremities.  Psychiatric: Alert, oriented to self only. Calm and cooperative.    Labs and Imaging on Admission: I have personally reviewed following labs and imaging studies  CBC: Recent Labs  Lab 01/06/21 1740 01/06/21 1854  WBC 5.9  --   NEUTROABS 4.8  --   HGB 11.7* 13.9  HCT 36.9 41.0  MCV 87.2  --   PLT 273  --    Basic Metabolic Panel: Recent Labs  Lab 01/06/21 1740 01/06/21 1854  NA 136 135  K 4.0 3.9  CL 95* 95*  CO2 31  --   GLUCOSE 123* 117*  BUN 15 16  CREATININE 0.79 0.80  CALCIUM 9.3  --   MG 2.2  --    GFR: CrCl cannot be calculated (Unknown ideal weight.). Liver Function Tests: Recent Labs  Lab 01/06/21 1740  AST 24  ALT 12  ALKPHOS 69  BILITOT 0.3  PROT 6.7  ALBUMIN  3.2*   No results for input(s): LIPASE, AMYLASE in the last 168 hours. No results for input(s): AMMONIA in the last 168 hours. Coagulation Profile: No results for input(s): INR, PROTIME in the last 168 hours. Cardiac Enzymes: No results for input(s): CKTOTAL, CKMB, CKMBINDEX, TROPONINI in the last 168 hours. BNP (last 3 results) No results for input(s): PROBNP in the last 8760 hours. HbA1C: No results for input(s): HGBA1C in the last 72 hours. CBG: Recent Labs  Lab 01/06/21 1854  GLUCAP 101*   Lipid Profile: No results for input(s): CHOL, HDL, LDLCALC, TRIG, CHOLHDL, LDLDIRECT in the last 72 hours. Thyroid Function Tests: No results for input(s): TSH, T4TOTAL, FREET4, T3FREE, THYROIDAB in the last 72 hours. Anemia Panel: No results for input(s): VITAMINB12, FOLATE, FERRITIN, TIBC, IRON,  RETICCTPCT in the last 72 hours. Urine analysis:    Component Value Date/Time   LABSPEC 1.015 11/22/2020 1545   PHURINE 8.5 (H) 11/22/2020 1545   GLUCOSEU NEGATIVE 11/22/2020 1545   HGBUR TRACE (A) 11/22/2020 1545   BILIRUBINUR NEGATIVE 11/22/2020 1545   KETONESUR NEGATIVE 11/22/2020 1545   PROTEINUR NEGATIVE 11/22/2020 1545   UROBILINOGEN 0.2 11/22/2020 1545   NITRITE NEGATIVE 11/22/2020 1545   LEUKOCYTESUR LARGE (A) 11/22/2020 1545   Sepsis Labs: @LABRCNTIP (procalcitonin:4,lacticidven:4) )No results found for this or any previous visit (from the past 240 hour(s)).   Radiological Exams on Admission: CT HEAD WO CONTRAST  Result Date: 01/06/2021 CLINICAL DATA:  Larey SeatFell.  Hit head. EXAM: CT HEAD WITHOUT CONTRAST CT CERVICAL SPINE WITHOUT CONTRAST TECHNIQUE: Multidetector CT imaging of the head and cervical spine was performed following the standard protocol without intravenous contrast. Multiplanar CT image reconstructions of the cervical spine were also generated. COMPARISON:  11/13/2020 FINDINGS: CT HEAD FINDINGS Brain: Stable severe cerebral atrophy, ventriculomegaly and periventricular  white matter disease. No acute intracranial findings such as hemispheric infarction or intracranial hemorrhage. No extra-axial fluid collections. No mass lesions. The brainstem and cerebellum are grossly normal in stable. Vascular: Stable vascular calcifications. No aneurysm or hyperdense vessels. Skull: No acute skull fracture.  No bone lesions. Sinuses/Orbits: The paranasal sinuses and mastoid air cells are clear. The globes are intact. Other: No scalp lesions or scalp hematoma. CT CERVICAL SPINE FINDINGS Alignment: Overall normal alignment. Skull base and vertebrae: No acute fracture. No primary bone lesion or focal pathologic process. Soft tissues and spinal canal: No prevertebral fluid or swelling. No visible canal hematoma. Disc levels: The spinal canal is fairly generous. No significant spinal stenosis. Upper chest: No significant findings. Other: No significant findings. IMPRESSION: 1. Stable severe cerebral atrophy, ventriculomegaly and periventricular white matter disease. 2. No acute intracranial findings or skull fracture. 3. Normal alignment of the cervical vertebral bodies and no acute cervical spine fracture. Electronically Signed   By: Rudie MeyerP.  Gallerani M.D.   On: 01/06/2021 19:55   CT CERVICAL SPINE WO CONTRAST  Result Date: 01/06/2021 CLINICAL DATA:  Larey SeatFell.  Hit head. EXAM: CT HEAD WITHOUT CONTRAST CT CERVICAL SPINE WITHOUT CONTRAST TECHNIQUE: Multidetector CT imaging of the head and cervical spine was performed following the standard protocol without intravenous contrast. Multiplanar CT image reconstructions of the cervical spine were also generated. COMPARISON:  11/13/2020 FINDINGS: CT HEAD FINDINGS Brain: Stable severe cerebral atrophy, ventriculomegaly and periventricular white matter disease. No acute intracranial findings such as hemispheric infarction or intracranial hemorrhage. No extra-axial fluid collections. No mass lesions. The brainstem and cerebellum are grossly normal in stable.  Vascular: Stable vascular calcifications. No aneurysm or hyperdense vessels. Skull: No acute skull fracture.  No bone lesions. Sinuses/Orbits: The paranasal sinuses and mastoid air cells are clear. The globes are intact. Other: No scalp lesions or scalp hematoma. CT CERVICAL SPINE FINDINGS Alignment: Overall normal alignment. Skull base and vertebrae: No acute fracture. No primary bone lesion or focal pathologic process. Soft tissues and spinal canal: No prevertebral fluid or swelling. No visible canal hematoma. Disc levels: The spinal canal is fairly generous. No significant spinal stenosis. Upper chest: No significant findings. Other: No significant findings. IMPRESSION: 1. Stable severe cerebral atrophy, ventriculomegaly and periventricular white matter disease. 2. No acute intracranial findings or skull fracture. 3. Normal alignment of the cervical vertebral bodies and no acute cervical spine fracture. Electronically Signed   By: Rudie MeyerP.  Gallerani M.D.   On: 01/06/2021 19:55   DG  Chest Port 1 View  Result Date: 01/06/2021 CLINICAL DATA:  Syncopal episode. EXAM: PORTABLE CHEST 1 VIEW COMPARISON:  11/05/2019 FINDINGS: The cardiac silhouette, mediastinal and hilar contours are stable. Moderate tortuosity and calcification of the thoracic aorta. Chronic basilar scarring changes. No infiltrates or effusions. No pneumothorax. Remote healed rib fractures are noted.  No definite acute fractures. IMPRESSION: Chronic basilar scarring changes but no acute pulmonary findings. Electronically Signed   By: Rudie Meyer M.D.   On: 01/06/2021 18:21    EKG: Independently reviewed. Sinus rhythm, QTc 564 ms.   Assessment/Plan   1. Syncope  - Presents after a syncopal episode that occurred in the bathroom and associated with reported transient bradycardia and hypotension  - HR ~60 on EMS strips; EKG in ED with narrow QRS, no blocks, and QTc 564 ms  - Possibly vasovagal episode or cardiogenic, no rhythm strip  - Continue  cardiac monitoring, check orthostatic vitals and echocardiogram    2. Prolonged QT interval  - QTc is 564 ms on admission  - Potassium is 4.0 and mag 2.2  - Check TSH, minimize QT-prolonging medications    3. Chronic pain  - No pain complaints on admission  - Continue acetaminophen and topicals    4. Pressure sores   - Present on arrival, continue wound care   5. Dementia  - Supportive care, delirium precautions     DVT prophylaxis: Lovenox  Code Status: DNR, confirmed with patient's daughter on admission  Level of Care: Level of care: Telemetry Cardiac Family Communication: Daughter updated at bedside Disposition Plan:  Patient is from: home  Anticipated d/c is to: Home  Anticipated d/c date is: 01/07/21 Patient currently: pending cardiac monitoring, echocardiogram  Consults called: none  Admission status: Observation     Briscoe Deutscher, MD [facility]  01/06/2021, 9:46 PM

## 2021-01-07 ENCOUNTER — Observation Stay (HOSPITAL_BASED_OUTPATIENT_CLINIC_OR_DEPARTMENT_OTHER): Payer: Medicare (Managed Care)

## 2021-01-07 DIAGNOSIS — R55 Syncope and collapse: Secondary | ICD-10-CM

## 2021-01-07 DIAGNOSIS — I4581 Long QT syndrome: Secondary | ICD-10-CM | POA: Diagnosis not present

## 2021-01-07 DIAGNOSIS — F0390 Unspecified dementia without behavioral disturbance: Secondary | ICD-10-CM | POA: Diagnosis not present

## 2021-01-07 DIAGNOSIS — Z20822 Contact with and (suspected) exposure to covid-19: Secondary | ICD-10-CM | POA: Diagnosis not present

## 2021-01-07 LAB — SARS CORONAVIRUS 2 (TAT 6-24 HRS): SARS Coronavirus 2: NEGATIVE

## 2021-01-07 LAB — BASIC METABOLIC PANEL
Anion gap: 7 (ref 5–15)
BUN: 12 mg/dL (ref 8–23)
CO2: 31 mmol/L (ref 22–32)
Calcium: 8.7 mg/dL — ABNORMAL LOW (ref 8.9–10.3)
Chloride: 97 mmol/L — ABNORMAL LOW (ref 98–111)
Creatinine, Ser: 0.7 mg/dL (ref 0.44–1.00)
GFR, Estimated: >60 mL/min (ref >60)
Glucose, Bld: 105 mg/dL — ABNORMAL HIGH (ref 70–99)
Potassium: 3.8 mmol/L (ref 3.5–5.1)
Sodium: 135 mmol/L (ref 135–145)

## 2021-01-07 LAB — URINALYSIS, COMPLETE (UACMP) WITH MICROSCOPIC
Bilirubin Urine: NEGATIVE
Glucose, UA: NEGATIVE mg/dL
Ketones, ur: NEGATIVE mg/dL
Nitrite: POSITIVE — AB
Protein, ur: NEGATIVE mg/dL
Specific Gravity, Urine: 1.01 (ref 1.005–1.030)
pH: 7.5 (ref 5.0–8.0)

## 2021-01-07 LAB — ECHOCARDIOGRAM COMPLETE
AR max vel: 2.49 cm2
AV Area VTI: 2.55 cm2
AV Area mean vel: 2.47 cm2
AV Mean grad: 7 mmHg
AV Peak grad: 12.1 mmHg
Ao pk vel: 1.74 m/s
Area-P 1/2: 3.75 cm2
Height: 61 in
MV VTI: 3.35 cm2
S' Lateral: 2.2 cm
Weight: 1643.75 oz

## 2021-01-07 LAB — CBC
HCT: 33 % — ABNORMAL LOW (ref 36.0–46.0)
Hemoglobin: 10.8 g/dL — ABNORMAL LOW (ref 12.0–15.0)
MCH: 27.9 pg (ref 26.0–34.0)
MCHC: 32.7 g/dL (ref 30.0–36.0)
MCV: 85.3 fL (ref 80.0–100.0)
Platelets: 253 10*3/uL (ref 150–400)
RBC: 3.87 MIL/uL (ref 3.87–5.11)
RDW: 15.6 % — ABNORMAL HIGH (ref 11.5–15.5)
WBC: 5 10*3/uL (ref 4.0–10.5)
nRBC: 0 % (ref 0.0–0.2)

## 2021-01-07 LAB — GLUCOSE, CAPILLARY: Glucose-Capillary: 100 mg/dL — ABNORMAL HIGH (ref 70–99)

## 2021-01-07 LAB — TSH: TSH: 2.094 u[IU]/mL (ref 0.350–4.500)

## 2021-01-07 LAB — MAGNESIUM: Magnesium: 2 mg/dL (ref 1.7–2.4)

## 2021-01-07 MED ORDER — DOXYCYCLINE MONOHYDRATE 100 MG PO TABS: 100.0000 mg | ORAL_TABLET | Freq: Two times a day (BID) | ORAL | 0 refills | Status: AC

## 2021-01-07 NOTE — Progress Notes (Signed)
  Echocardiogram 2D Echocardiogram has been performed.  Bailynn Dyk  Marthann Schiller 01/07/2021, 8:22 AM

## 2021-01-07 NOTE — Progress Notes (Signed)
PT Cancellation Note  Patient Details Name: Angela Coleman MRN: [id number] DOB: 04-22-1922   Cancelled Treatment:    Reason Eval/Treat Not Completed: Other (comment) Noted pt with d/c orders.  Spoke with RN who wsa in room getting pt ready for d/c. She reports transport has been called and per MD pt ok to d/c without PT eval.  Pt is non-ambulatory at baseline and [facility]. If pt remains hospitalized, will f/u as able.  Anise Salvo, PT Acute Rehab Services Pager [phone] [facility] [phone]  [place], [place] [facility] Pager [phone] [facility] [phone]   Rayetta Humphrey 01/07/2021, 3:32 PM

## 2021-01-07 NOTE — Discharge Summary (Signed)
Physician Discharge Summary  Angela Coleman WUJ:[id number] DOB: 1921-10-16 DOA: 01/06/2021  PCP: Jethro Bastos, MD  Admit date: 01/06/2021 Discharge date: 01/07/2021  Time spent: 40 minutes  Recommendations for Outpatient Follow-up:  Follow outpatient CBC/CMP Follow wound outpatient, given doxy x5 days Continue care per [facility]   Discharge Diagnoses:  Principal Problem:   Syncope Active Problems:   Prolonged QT interval   Chronic pain   Pressure sore   Discharge Condition: stable  Diet recommendation: heart healthy  Filed Weights   01/06/21 2250 01/07/21 0229  Weight: 46.6 kg 46.6 kg    History of present illness:  Angela Coleman is Angela Coleman 87 y.o. female with medical history significant for Bell's palsy, glaucoma, dementia, chronic pain, and history of GI bleeding, now presenting to the emergency department for evaluation of syncope.  Patient is accompanied by her daughter who assists with the history.  Patient lives with her daughter and attends an adult daycare.  Per discussion with staff at [facility], she had Angela Coleman presyncopal episode when using the bathroom.  HR reportedly in the 40's and initial BP apparently 80's systolic.  Reportedly she improved with Angela Coleman passive leg raise.  At baseline she's essentially bedbound and wheelchair bound.  Requires 2 person assistance for transfers.  She was admitted to the hospital in the setting of presyncopal.  She appears to be back to her baseline on hospital day 1.  Echo without concerning findings.  Tele as well.  Discussed case with [facility] nurse and pt daughter.  Plan for discharge today.       See below for additional details  Hospital Course:  1. Presyncope - per discussion with RN at [facility], no true loss of consciousness, presyncopal episode when she was using the bathroom - given hx vagal event seems to be most likely cause - EKG at presentation prolonged Qtc, improved this morning - echo with EF 60-65%, grade 1 diastolic dysfunction - tele  without concerning findings - head Ct without acute findings, no acute c spine fx - discharge back to [facility], follow and w/u further as indicated   2. Prolonged QT interval  - QTc is 564 ms on admission  - repeat Qtc wnl this AM  - Potassium is 4.0 and mag 2.2  - Check TSH wnl   3. Chronic pain  - No pain complaints on admission  - Continue acetaminophen and topicals     4. Pressure sores  Right Lateral Malleolus Ulcer   - exudate to R lateral mal, will give doxy    5. Dementia  - Supportive care, delirium precautions     Procedures: Echo IMPRESSIONS     1. Left ventricular ejection fraction, by estimation, is 60 to 65%. The  left ventricle has normal function. The left ventricle has no regional  wall motion abnormalities. There is moderate focal basal septal left  ventricular hypertrophy. No significant  LV outflow tract gradient or mitral valve systolic anterior motion. Left  ventricular diastolic parameters are consistent with Grade I diastolic  dysfunction (impaired relaxation).   2. The aortic valve is tricuspid. Aortic valve regurgitation is trivial.  Mild to moderate aortic valve sclerosis/calcification is present, without  any evidence of aortic stenosis.   3. Aortic dilatation noted. There is mild dilatation of the ascending  aorta, measuring 42 mm.   4. Right ventricular systolic function is normal. The right ventricular  size is normal. Tricuspid regurgitation signal is inadequate for assessing  PA pressure.   5. The  mitral valve is normal in structure. Trivial mitral valve  regurgitation. No evidence of mitral stenosis.   6. The inferior vena cava is normal in size with greater than 50%  respiratory variability, suggesting right atrial pressure of 3 mmHg.   Consultations: none  Discharge Exam: Vitals:   01/07/21 0546 01/07/21 0826  BP: 119/74 131/77  Pulse: 65 62  Resp: 17 18  Temp: 98.5 F (36.9 C) 98.6 F (37 C)  SpO2: 100% 100%   Confused,  pleasantly - at baseline per daughter  General: No acute distress. Cardiovascular: Heart sounds show Angela Coleman regular rate, and rhythm. Lungs: Clear to auscultation bilaterally  Abdomen: Soft, nontender, nondistended Neurological: Alert and oriented 1-2. Moves all extremities 4. Cranial nerves II through XII grossly intact. MSK - no ttp to bilateral legs, stable hip, no ttp to upper extremities Skin: ucleration with exudate to lateral mal on R Extremities: No clubbing or cyanosis. No edema.  Discharge Instructions   Discharge Instructions     Call MD for:  difficulty breathing, headache or visual disturbances   Complete by: As directed    Call MD for:  extreme fatigue   Complete by: As directed    Call MD for:  hives   Complete by: As directed    Call MD for:  persistant dizziness or light-headedness   Complete by: As directed    Call MD for:  persistant nausea and vomiting   Complete by: As directed    Call MD for:  redness, tenderness, or signs of infection (pain, swelling, redness, odor or green/yellow discharge around incision site)   Complete by: As directed    Call MD for:  severe uncontrolled pain   Complete by: As directed    Call MD for:  temperature >100.4   Complete by: As directed    Diet - low sodium heart healthy   Complete by: As directed    Discharge instructions   Complete by: As directed    You were seen for an episode of near syncope (almost fainting) on the toilet.  This, to me, sounds most like Angela Coleman vagal event related to straining.    You had an echo without any concerning findings and your telemetry was reassuring.   Please follow up with your PCP as an outpatient for additional workup as indicated.  Continue wound care to your pressure wound on your right ankle with [facility].  Watch for symptoms of infection.  There's Jayin Derousse little bit of discharge/exudate, I'll send you on doxycycline for Keyler Hoge few days (antibiotic).  Return for new, recurrent, or worsening  symptoms.  Please ask your PCP to request records from this hospitalization so they know what was done and what the next steps will be.   Discharge wound care:   Complete by: As directed    Resume prior to admission wound care for pressure ulcers   Increase activity slowly   Complete by: As directed    No wound care   Complete by: As directed       Allergies as of 01/07/2021   No Known Allergies      Medication List     TAKE these medications    acetaminophen 325 MG tablet Commonly known as: TYLENOL Take 650 mg by mouth 2 (two) times daily. For arthritis/face pain   BIOFREEZE EX Apply 1 application topically every 4 (four) hours as needed (painful muscles/joints).   doxycycline 100 MG tablet Commonly known as: ADOXA Take 1 tablet (100 mg total) by  mouth 2 (two) times daily for 5 days.   DULoxetine 30 MG capsule Commonly known as: CYMBALTA Take 30 mg by mouth daily.   feeding supplement Liqd Take 237 mLs by mouth 2 (two) times daily between meals.   hydroxypropyl methylcellulose / hypromellose 2.5 % ophthalmic solution Commonly known as: ISOPTO TEARS / GONIOVISC Place 1 drop into both eyes 4 (four) times daily as needed for dry eyes.   Lubriderm Lotn Apply 1 application topically every 2 (two) hours as needed (itching/skin dryness).   melatonin 3 MG Tabs tablet Take 3 mg by mouth at bedtime.   PreserVision AREDS 2 Caps Take 1 capsule by mouth daily.   sennosides-docusate sodium 8.6-50 MG tablet Commonly known as: SENOKOT-S Take 2 tablets by mouth 2 (two) times daily as needed for constipation.   Vitamin D (Ergocalciferol) 1.25 MG (50000 UNIT) Caps capsule Commonly known as: DRISDOL Take 50,000 Units by mouth every 30 (thirty) days.       ASK your doctor about these medications    ferrous sulfate 325 (65 FE) MG tablet Commonly known as: FerrouSul Take 1 tablet (325 mg total) by mouth daily with breakfast.   pantoprazole 40 MG tablet Commonly known  as: PROTONIX Take 1 tablet (40 mg total) by mouth daily.               Discharge Care Instructions  (From admission, onward)           Start     Ordered   01/07/21 0000  Discharge wound care:       Comments: Resume prior to admission wound care for pressure ulcers   01/07/21 1430           No Known Allergies    The results of significant diagnostics from this hospitalization (including imaging, microbiology, ancillary and laboratory) are listed below for reference.    Significant Diagnostic Studies: CT HEAD WO CONTRAST  Result Date: 01/06/2021 CLINICAL DATA:  Larey Seat.  Hit head. EXAM: CT HEAD WITHOUT CONTRAST CT CERVICAL SPINE WITHOUT CONTRAST TECHNIQUE: Multidetector CT imaging of the head and cervical spine was performed following the standard protocol without intravenous contrast. Multiplanar CT image reconstructions of the cervical spine were also generated. COMPARISON:  11/13/2020 FINDINGS: CT HEAD FINDINGS Brain: Stable severe cerebral atrophy, ventriculomegaly and periventricular white matter disease. No acute intracranial findings such as hemispheric infarction or intracranial hemorrhage. No extra-axial fluid collections. No mass lesions. The brainstem and cerebellum are grossly normal in stable. Vascular: Stable vascular calcifications. No aneurysm or hyperdense vessels. Skull: No acute skull fracture.  No bone lesions. Sinuses/Orbits: The paranasal sinuses and mastoid air cells are clear. The globes are intact. Other: No scalp lesions or scalp hematoma. CT CERVICAL SPINE FINDINGS Alignment: Overall normal alignment. Skull base and vertebrae: No acute fracture. No primary bone lesion or focal pathologic process. Soft tissues and spinal canal: No prevertebral fluid or swelling. No visible canal hematoma. Disc levels: The spinal canal is fairly generous. No significant spinal stenosis. Upper chest: No significant findings. Other: No significant findings. IMPRESSION: 1. Stable  severe cerebral atrophy, ventriculomegaly and periventricular white matter disease. 2. No acute intracranial findings or skull fracture. 3. Normal alignment of the cervical vertebral bodies and no acute cervical spine fracture. Electronically Signed   By: Rudie Meyer M.D.   On: 01/06/2021 19:55   CT CERVICAL SPINE WO CONTRAST  Result Date: 01/06/2021 CLINICAL DATA:  Larey Seat.  Hit head. EXAM: CT HEAD WITHOUT CONTRAST CT CERVICAL SPINE WITHOUT CONTRAST TECHNIQUE:  Multidetector CT imaging of the head and cervical spine was performed following the standard protocol without intravenous contrast. Multiplanar CT image reconstructions of the cervical spine were also generated. COMPARISON:  11/13/2020 FINDINGS: CT HEAD FINDINGS Brain: Stable severe cerebral atrophy, ventriculomegaly and periventricular white matter disease. No acute intracranial findings such as hemispheric infarction or intracranial hemorrhage. No extra-axial fluid collections. No mass lesions. The brainstem and cerebellum are grossly normal in stable. Vascular: Stable vascular calcifications. No aneurysm or hyperdense vessels. Skull: No acute skull fracture.  No bone lesions. Sinuses/Orbits: The paranasal sinuses and mastoid air cells are clear. The globes are intact. Other: No scalp lesions or scalp hematoma. CT CERVICAL SPINE FINDINGS Alignment: Overall normal alignment. Skull base and vertebrae: No acute fracture. No primary bone lesion or focal pathologic process. Soft tissues and spinal canal: No prevertebral fluid or swelling. No visible canal hematoma. Disc levels: The spinal canal is fairly generous. No significant spinal stenosis. Upper chest: No significant findings. Other: No significant findings. IMPRESSION: 1. Stable severe cerebral atrophy, ventriculomegaly and periventricular white matter disease. 2. No acute intracranial findings or skull fracture. 3. Normal alignment of the cervical vertebral bodies and no acute cervical spine fracture.  Electronically Signed   By: Rudie Meyer M.D.   On: 01/06/2021 19:55   DG Chest Port 1 View  Result Date: 01/06/2021 CLINICAL DATA:  Syncopal episode. EXAM: PORTABLE CHEST 1 VIEW COMPARISON:  11/05/2019 FINDINGS: The cardiac silhouette, mediastinal and hilar contours are stable. Moderate tortuosity and calcification of the thoracic aorta. Chronic basilar scarring changes. No infiltrates or effusions. No pneumothorax. Remote healed rib fractures are noted.  No definite acute fractures. IMPRESSION: Chronic basilar scarring changes but no acute pulmonary findings. Electronically Signed   By: Rudie Meyer M.D.   On: 01/06/2021 18:21   ECHOCARDIOGRAM COMPLETE  Result Date: 01/07/2021    ECHOCARDIOGRAM REPORT   Patient Name:   Angela Coleman Date of Exam: 01/07/2021 Medical Rec #:  [id number]    Height:       61.0 in Accession #:    [id number]   Weight:       102.7 lb Date of Birth:  01-17-1922    BSA:          1.423 m Patient Age:    87 years     BP:           119/74 mmHg Patient Gender: F            HR:           65 bpm. Exam Location:  Inpatient Procedure: 2D Echo, Cardiac Doppler and Color Doppler Indications:    Syncope  History:        Patient has no prior history of Echocardiogram examinations.                 Risk Factors:Hypertension.  Sonographer:    Shirlean Kelly Referring Phys: [id number] TIMOTHY S OPYD IMPRESSIONS  1. Left ventricular ejection fraction, by estimation, is 60 to 65%. The left ventricle has normal function. The left ventricle has no regional wall motion abnormalities. There is moderate focal basal septal left ventricular hypertrophy. No significant LV outflow tract gradient or mitral valve systolic anterior motion. Left ventricular diastolic parameters are consistent with Grade I diastolic dysfunction (impaired relaxation).  2. The aortic valve is tricuspid. Aortic valve regurgitation is trivial. Mild to moderate aortic valve sclerosis/calcification is present, without any evidence of  aortic stenosis.  3. Aortic dilatation noted. There is mild  dilatation of the ascending aorta, measuring 42 mm.  4. Right ventricular systolic function is normal. The right ventricular size is normal. Tricuspid regurgitation signal is inadequate for assessing PA pressure.  5. The mitral valve is normal in structure. Trivial mitral valve regurgitation. No evidence of mitral stenosis.  6. The inferior vena cava is normal in size with greater than 50% respiratory variability, suggesting right atrial pressure of 3 mmHg. FINDINGS  Left Ventricle: Left ventricular ejection fraction, by estimation, is 60 to 65%. The left ventricle has normal function. The left ventricle has no regional wall motion abnormalities. The left ventricular internal cavity size was normal in size. There is  moderate focal basal septal left ventricular hypertrophy. Left ventricular diastolic parameters are consistent with Grade I diastolic dysfunction (impaired relaxation). Right Ventricle: The right ventricular size is normal. No increase in right ventricular wall thickness. Right ventricular systolic function is normal. Tricuspid regurgitation signal is inadequate for assessing PA pressure. Left Atrium: Left atrial size was normal in size. Right Atrium: Right atrial size was normal in size. Pericardium: There is no evidence of pericardial effusion. Mitral Valve: The mitral valve is normal in structure. Trivial mitral valve regurgitation. No evidence of mitral valve stenosis. MV peak gradient, 7.8 mmHg. The mean mitral valve gradient is 2.0 mmHg. Tricuspid Valve: The tricuspid valve is normal in structure. Tricuspid valve regurgitation is trivial. Aortic Valve: The aortic valve is tricuspid. Aortic valve regurgitation is trivial. Mild to moderate aortic valve sclerosis/calcification is present, without any evidence of aortic stenosis. Aortic valve mean gradient measures 7.0 mmHg. Aortic valve peak  gradient measures 12.1 mmHg. Aortic valve area,  by VTI measures 2.55 cm. Pulmonic Valve: The pulmonic valve was normal in structure. Pulmonic valve regurgitation is trivial. Aorta: The aortic root is normal in size and structure and aortic dilatation noted. There is mild dilatation of the ascending aorta, measuring 42 mm. Venous: The inferior vena cava is normal in size with greater than 50% respiratory variability, suggesting right atrial pressure of 3 mmHg. IAS/Shunts: No atrial level shunt detected by color flow Doppler.  LEFT VENTRICLE PLAX 2D LVIDd:         3.30 cm  Diastology LVIDs:         2.20 cm  LV e' medial:    3.05 cm/s LV PW:         1.20 cm  LV E/e' medial:  22.2 LV IVS:        2.20 cm  LV e' lateral:   5.11 cm/s LVOT diam:     2.00 cm  LV E/e' lateral: 13.2 LV SV:         102 LV SV Index:   72 LVOT Area:     3.14 cm  RIGHT VENTRICLE             IVC RV S prime:     11.50 cm/s  IVC diam: 1.20 cm LEFT ATRIUM             Index LA diam:        2.80 cm 1.97 cm/m LA Vol (A2C):   36.7 ml 25.79 ml/m LA Vol (A4C):   34.5 ml 24.24 ml/m LA Biplane Vol: 35.1 ml 24.67 ml/m  AORTIC VALVE AV Area (Vmax):    2.49 cm AV Area (Vmean):   2.47 cm AV Area (VTI):     2.55 cm AV Vmax:           174.00 cm/s AV Vmean:  118.000 cm/s AV VTI:            0.401 m AV Peak Grad:      12.1 mmHg AV Mean Grad:      7.0 mmHg LVOT Vmax:         138.00 cm/s LVOT Vmean:        92.800 cm/s LVOT VTI:          0.326 m LVOT/AV VTI ratio: 0.81  AORTA Ao Root diam: 3.30 cm Ao Asc diam:  3.90 cm MITRAL VALVE MV Area (PHT): 3.75 cm     SHUNTS MV Area VTI:   3.35 cm     Systemic VTI:  0.33 m MV Peak grad:  7.8 mmHg     Systemic Diam: 2.00 cm MV Mean grad:  2.0 mmHg MV Vmax:       1.40 m/s MV Vmean:      70.4 cm/s MV Decel Time: 203 msec MV E velocity: 67.70 cm/s MV Annagrace Carr velocity: 126.00 cm/s MV E/Jahne Krukowski ratio:  0.54 Angela Anconaalton Mclean MD Electronically signed by Angela Anconaalton Mclean MD Signature Date/Time: 01/07/2021/12:02:26 PM    Final     Microbiology: Recent Results (from the past 240  hour(s))  SARS CORONAVIRUS 2 (TAT 6-24 HRS) Nasopharyngeal Nasopharyngeal Swab     Status: None   Collection Time: 01/06/21  8:44 PM   Specimen: Nasopharyngeal Swab  Result Value Ref Range Status   SARS Coronavirus 2 NEGATIVE NEGATIVE Final    Comment: (NOTE) SARS-CoV-2 target nucleic acids are NOT DETECTED.  The SARS-CoV-2 RNA is generally detectable in upper and lower respiratory specimens during the acute phase of infection. Negative results do not preclude SARS-CoV-2 infection, do not rule out co-infections with other pathogens, and should not be used as the sole basis for treatment or other patient management decisions. Negative results must be combined with clinical observations, patient history, and epidemiological information. The expected result is Negative.  Fact Sheet for Patients: HairSlick.nohttps://www.fda.gov/media/138098/download  Fact Sheet for Healthcare Providers: quierodirigir.comhttps://www.fda.gov/media/138095/download  This test is not yet approved or cleared by the [place] FDA and  has been authorized for detection and/or diagnosis of SARS-CoV-2 by FDA under an Emergency Use Authorization (EUA). This EUA will remain  in effect (meaning this test can be used) for the duration of the COVID-19 declaration under Se ction 564(b)(1) of the Act, 21 U.S.C. section 360bbb-3(b)(1), unless the authorization is terminated or revoked sooner.  Performed at [facility], [address] [place], [place] [address]      Labs: Basic Metabolic Panel: Recent Labs  Lab 01/06/21 1740 01/06/21 1854 01/07/21 0150  NA 136 135 135  K 4.0 3.9 3.8  CL 95* 95* 97*  CO2 31  --  31  GLUCOSE 123* 117* 105*  BUN 15 16 12   CREATININE 0.79 0.80 0.70  CALCIUM 9.3  --  8.7*  MG 2.2  --  2.0   Liver Function Tests: Recent Labs  Lab 01/06/21 1740  AST 24  ALT 12  ALKPHOS 69  BILITOT 0.3  PROT 6.7  ALBUMIN 3.2*   No results for input(s): LIPASE, AMYLASE in the last 168 hours. No  results for input(s): AMMONIA in the last 168 hours. CBC: Recent Labs  Lab 01/06/21 1740 01/06/21 1854 01/07/21 0150  WBC 5.9  --  5.0  NEUTROABS 4.8  --   --   HGB 11.7* 13.9 10.8*  HCT 36.9 41.0 33.0*  MCV 87.2  --  85.3  PLT 273  --  253   Cardiac Enzymes: No results for input(s): CKTOTAL, CKMB, CKMBINDEX, TROPONINI in the last 168 hours. BNP: BNP (last 3 results) No results for input(s): BNP in the last 8760 hours.  ProBNP (last 3 results) No results for input(s): PROBNP in the last 8760 hours.  CBG: Recent Labs  Lab 01/06/21 1854 01/07/21 0549  GLUCAP 101* 100*       Signed:  Lacretia Nicks MD.  [facility] 01/07/2021, 3:21 PM

## 2021-04-27 DEATH — deceased

## 2022-04-24 IMAGING — CT CT CERVICAL SPINE W/O CM
3 of 4 series · 10 of 33 positions shown, 12 images · non-contrast
Comparison: None.

CLINICAL DATA: [AGE] post fall off commode.

EXAM:
CT CERVICAL SPINE WITHOUT CONTRAST
TECHNIQUE: Multidetector CT imaging of the cervical spine was performed without
intravenous contrast. Multiplanar CT image reconstructions were also
generated.

[Series 6: orthogonal bone · axial · 0.21mm/px · z∈[-311,-215]mm · 2 of 115 slices shown, 3 images]
[im 29/115  soft-tissue]
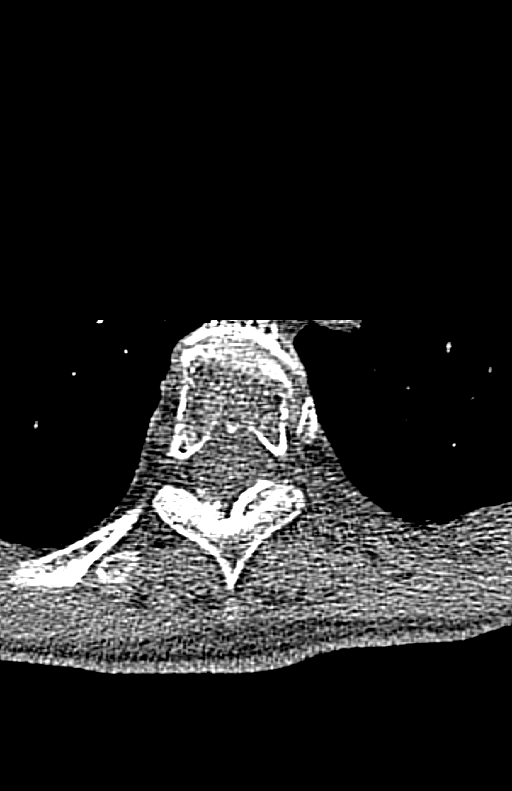
[im 29/115  bone]
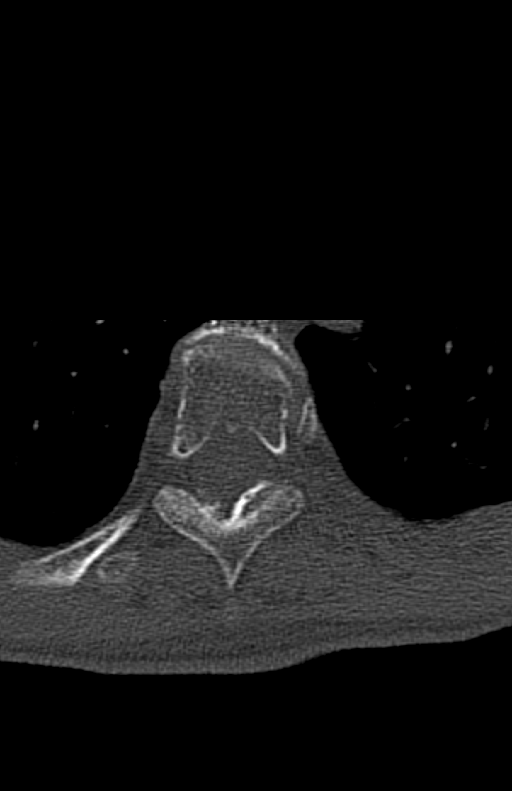
[im 86/115  bone]
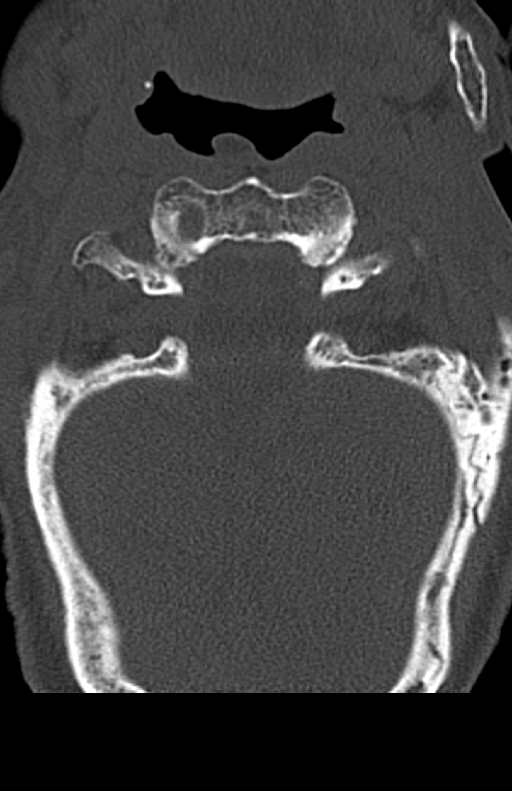

[Series 7: coronal bone · coronal · 0.21mm/px · 3 of 86 slices shown]
[im 30/86  bone]
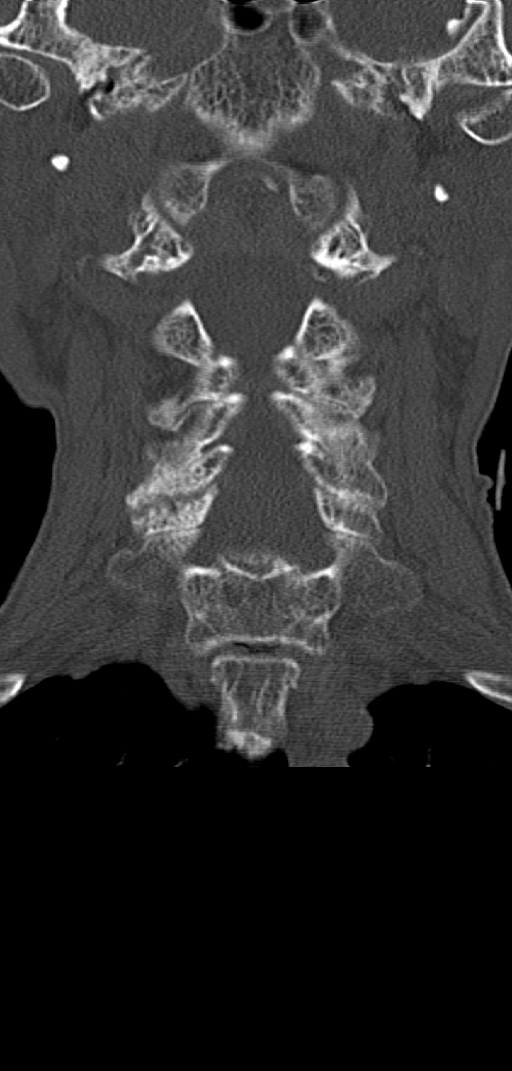
[im 39/86  bone]
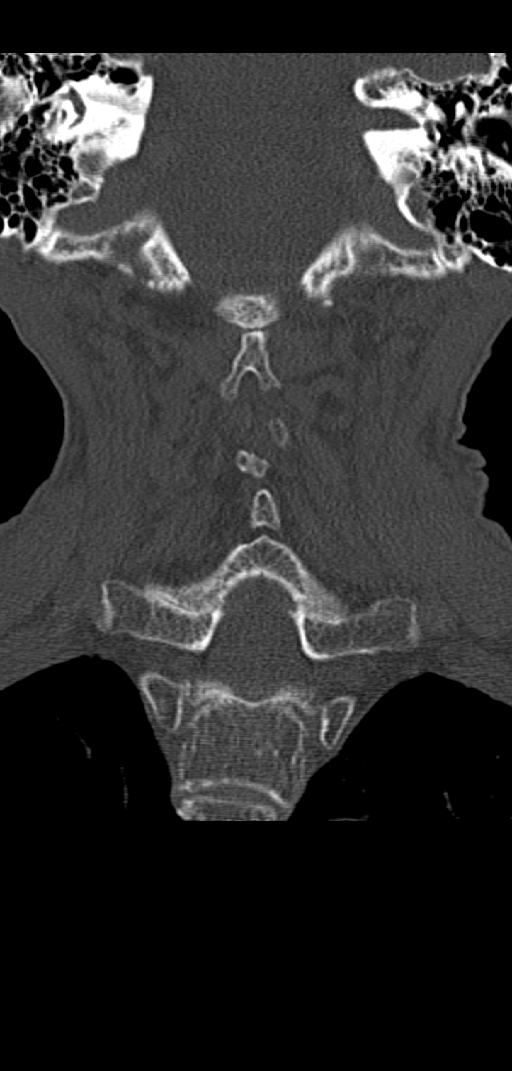
[im 48/86  bone]
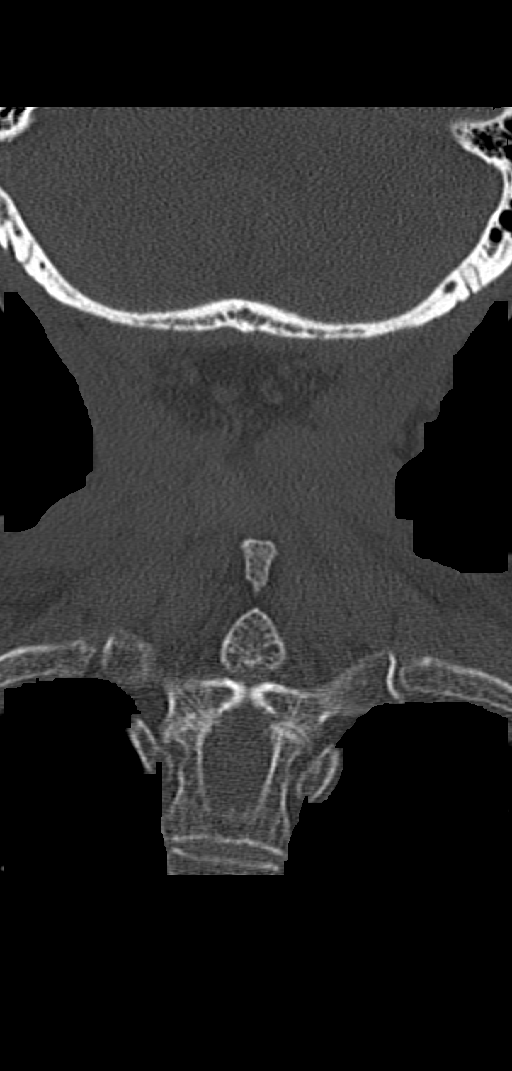

[Series 8: sagittal bone · sagittal · 0.33mm/px · 5 of 56 slices shown, 6 images]
[im 19/56  bone]
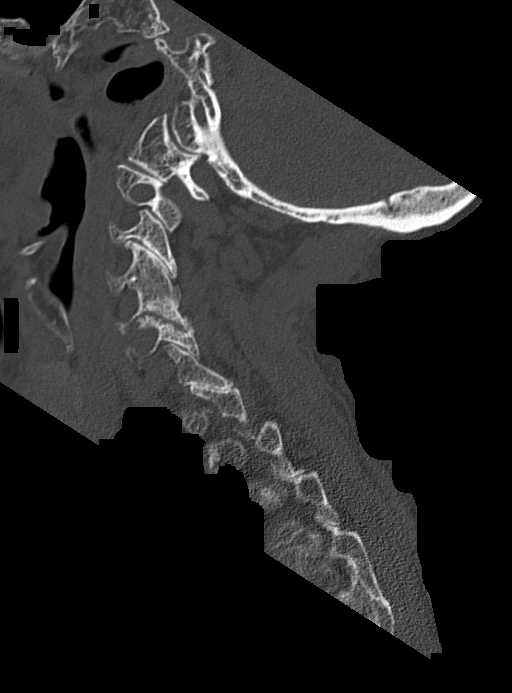
[im 23/56  bone]
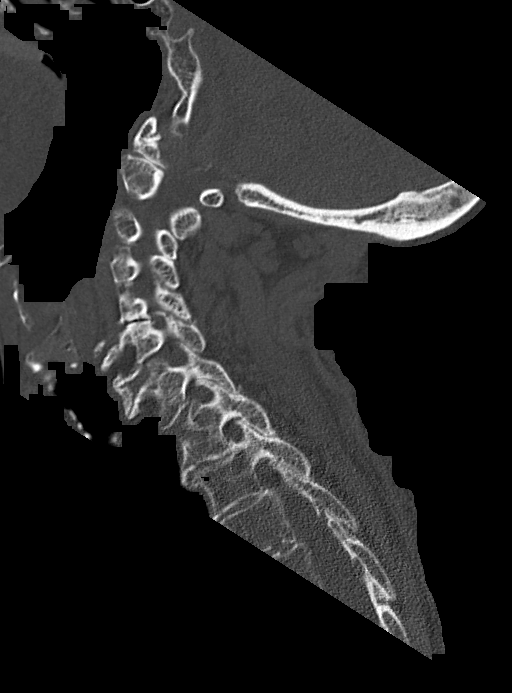
[im 28/56  soft-tissue]
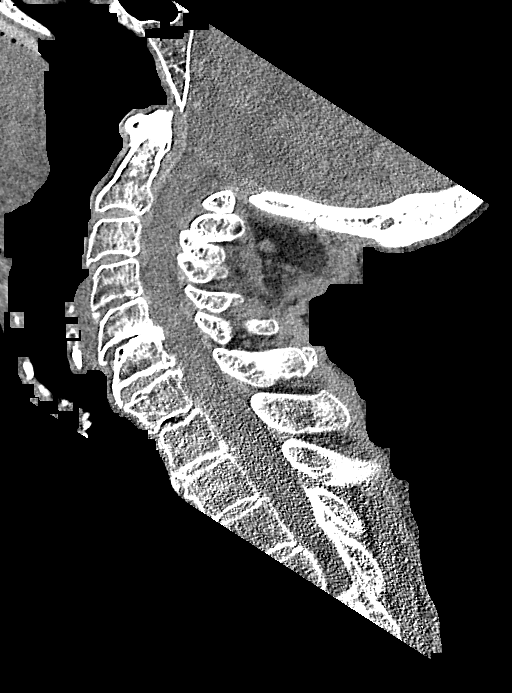
[im 28/56  bone]
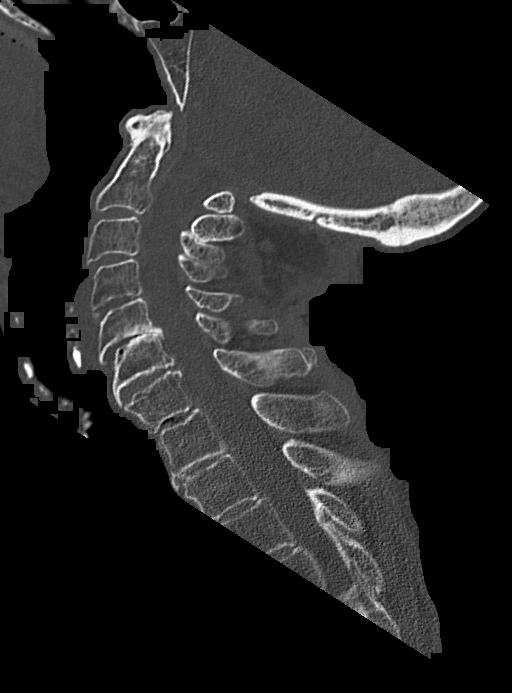
[im 33/56  bone]
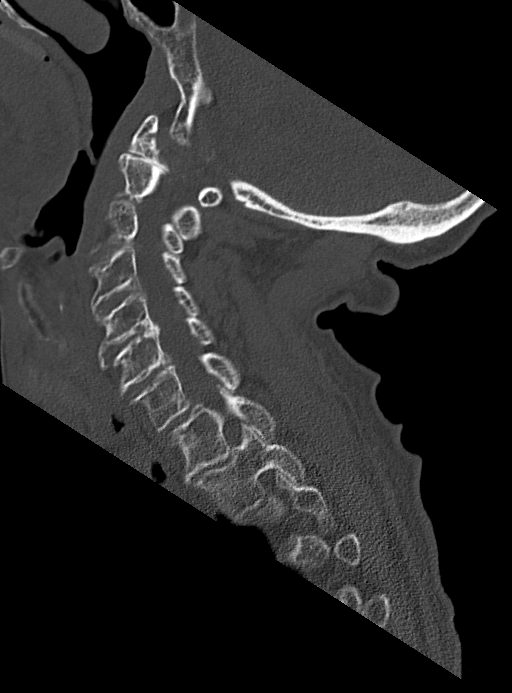
[im 37/56  bone]
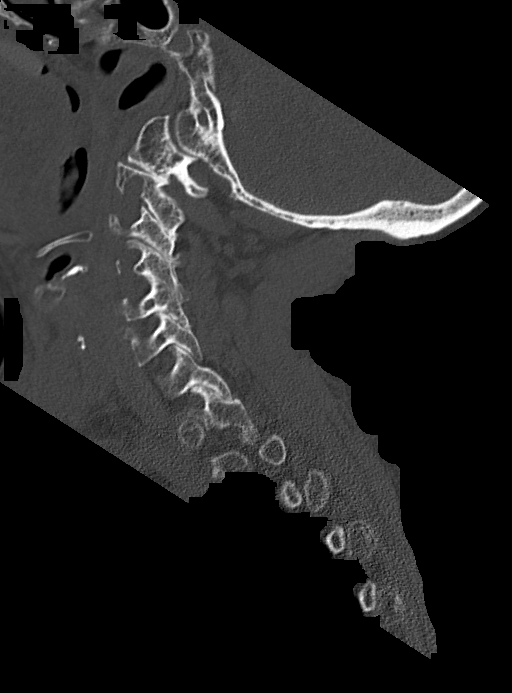

[10 of 33 positions shown; findings below may reference images not displayed]

FINDINGS: Alignment: Exaggerated cervical lordosis. No traumatic subluxation.
Posterior elements are normally aligned.

Skull base and vertebrae: No acute fracture. Vertebral body heights
are maintained. The dens and skull base are intact.

Soft tissues and spinal canal: No prevertebral fluid or swelling. No
visible canal hematoma.

Disc levels: Multilevel degenerative disc disease most prominently
affecting C5-C6. There is multilevel facet hypertrophy. Degenerative
facet ankylosis C4-C5 on the right.

Upper chest: No acute or unexpected findings.

Other: Mild carotid calcifications.
IMPRESSION: 1. No acute fracture or subluxation of the cervical spine.
2. Multilevel degenerative disc disease and facet hypertrophy.
